# Patient Record
Sex: Female | Born: 1965 | Race: Black or African American | Hispanic: No | Marital: Single | Smoking: Former smoker
Health system: Southern US, Community
[De-identification: ages and names within clinical notes are randomized; demographics above are authoritative.]

## PROBLEM LIST (undated history)

## (undated) DIAGNOSIS — E119 Type 2 diabetes mellitus without complications: Secondary | ICD-10-CM

## (undated) DIAGNOSIS — I639 Cerebral infarction, unspecified: Secondary | ICD-10-CM

## (undated) DIAGNOSIS — I10 Essential (primary) hypertension: Secondary | ICD-10-CM

---

## 2005-10-10 DIAGNOSIS — I10 Essential (primary) hypertension: Secondary | ICD-10-CM | POA: Insufficient documentation

## 2005-10-10 DIAGNOSIS — E785 Hyperlipidemia, unspecified: Secondary | ICD-10-CM | POA: Insufficient documentation

## 2010-04-29 ENCOUNTER — Emergency Department (HOSPITAL_COMMUNITY)
Admission: EM | Admit: 2010-04-29 | Discharge: 2010-04-29 | Payer: Self-pay | Source: Home / Self Care | Admitting: Emergency Medicine

## 2010-06-20 ENCOUNTER — Emergency Department (HOSPITAL_COMMUNITY)
Admission: EM | Admit: 2010-06-20 | Discharge: 2010-06-21 | Disposition: A | Discharge status: Home / Self Care | Payer: PRIVATE HEALTH INSURANCE | Attending: Emergency Medicine | Admitting: Emergency Medicine

## 2010-06-20 DIAGNOSIS — K089 Disorder of teeth and supporting structures, unspecified: Secondary | ICD-10-CM | POA: Insufficient documentation

## 2010-06-20 DIAGNOSIS — K056 Periodontal disease, unspecified: Secondary | ICD-10-CM | POA: Insufficient documentation

## 2010-06-20 DIAGNOSIS — K0510 Chronic gingivitis, plaque induced: Secondary | ICD-10-CM | POA: Insufficient documentation

## 2010-06-20 DIAGNOSIS — K069 Disorder of gingiva and edentulous alveolar ridge, unspecified: Secondary | ICD-10-CM | POA: Insufficient documentation

## 2010-06-20 DIAGNOSIS — I10 Essential (primary) hypertension: Secondary | ICD-10-CM | POA: Insufficient documentation

## 2010-06-20 DIAGNOSIS — I2510 Atherosclerotic heart disease of native coronary artery without angina pectoris: Secondary | ICD-10-CM | POA: Insufficient documentation

## 2010-06-20 DIAGNOSIS — E785 Hyperlipidemia, unspecified: Secondary | ICD-10-CM | POA: Insufficient documentation

## 2010-06-20 DIAGNOSIS — K029 Dental caries, unspecified: Secondary | ICD-10-CM | POA: Insufficient documentation

## 2010-06-20 DIAGNOSIS — J45909 Unspecified asthma, uncomplicated: Secondary | ICD-10-CM | POA: Insufficient documentation

## 2010-06-20 DIAGNOSIS — I252 Old myocardial infarction: Secondary | ICD-10-CM | POA: Insufficient documentation

## 2010-06-20 DIAGNOSIS — Z79899 Other long term (current) drug therapy: Secondary | ICD-10-CM | POA: Insufficient documentation

## 2010-07-15 ENCOUNTER — Emergency Department (HOSPITAL_COMMUNITY)
Admission: EM | Admit: 2010-07-15 | Discharge: 2010-07-15 | Disposition: A | Discharge status: Home / Self Care | Payer: Medicaid Other | Attending: Emergency Medicine | Admitting: Emergency Medicine

## 2010-07-15 DIAGNOSIS — Z7982 Long term (current) use of aspirin: Secondary | ICD-10-CM | POA: Insufficient documentation

## 2010-07-15 DIAGNOSIS — I252 Old myocardial infarction: Secondary | ICD-10-CM | POA: Insufficient documentation

## 2010-07-15 DIAGNOSIS — H5789 Other specified disorders of eye and adnexa: Secondary | ICD-10-CM | POA: Insufficient documentation

## 2010-07-15 DIAGNOSIS — I2510 Atherosclerotic heart disease of native coronary artery without angina pectoris: Secondary | ICD-10-CM | POA: Insufficient documentation

## 2010-07-15 DIAGNOSIS — J45909 Unspecified asthma, uncomplicated: Secondary | ICD-10-CM | POA: Insufficient documentation

## 2010-07-15 DIAGNOSIS — H11419 Vascular abnormalities of conjunctiva, unspecified eye: Secondary | ICD-10-CM | POA: Insufficient documentation

## 2010-07-15 DIAGNOSIS — H1010 Acute atopic conjunctivitis, unspecified eye: Secondary | ICD-10-CM | POA: Insufficient documentation

## 2010-07-15 DIAGNOSIS — Z79899 Other long term (current) drug therapy: Secondary | ICD-10-CM | POA: Insufficient documentation

## 2010-07-15 DIAGNOSIS — E785 Hyperlipidemia, unspecified: Secondary | ICD-10-CM | POA: Insufficient documentation

## 2010-07-15 DIAGNOSIS — I10 Essential (primary) hypertension: Secondary | ICD-10-CM | POA: Insufficient documentation

## 2011-01-13 ENCOUNTER — Emergency Department (HOSPITAL_COMMUNITY)
Admission: EM | Admit: 2011-01-13 | Discharge: 2011-01-13 | Disposition: A | Discharge status: Home / Self Care | Payer: PRIVATE HEALTH INSURANCE | Attending: Emergency Medicine | Admitting: Emergency Medicine

## 2011-01-13 DIAGNOSIS — R079 Chest pain, unspecified: Secondary | ICD-10-CM | POA: Insufficient documentation

## 2011-01-13 DIAGNOSIS — R42 Dizziness and giddiness: Secondary | ICD-10-CM | POA: Insufficient documentation

## 2011-01-13 LAB — DIFFERENTIAL
Basophils Relative: 0 % (ref 0–1)
Lymphs Abs: 4.6 10*3/uL — ABNORMAL HIGH (ref 0.7–4.0)
Monocytes Relative: 7 % (ref 3–12)
Neutro Abs: 5.5 10*3/uL (ref 1.7–7.7)
Neutrophils Relative %: 50 % (ref 43–77)

## 2011-01-13 LAB — COMPREHENSIVE METABOLIC PANEL
ALT: 11 U/L (ref 0–35)
AST: 10 U/L (ref 0–37)
Albumin: 3.7 g/dL (ref 3.5–5.2)
CO2: 29 mEq/L (ref 19–32)
Calcium: 9.3 mg/dL (ref 8.4–10.5)
Creatinine, Ser: 0.66 mg/dL (ref 0.50–1.10)
Sodium: 140 mEq/L (ref 135–145)
Total Protein: 7.3 g/dL (ref 6.0–8.3)

## 2011-01-13 LAB — CK TOTAL AND CKMB (NOT AT ARMC)
CK, MB: 2.7 ng/mL (ref 0.3–4.0)
Relative Index: INVALID (ref 0.0–2.5)
Total CK: 88 U/L (ref 7–177)

## 2011-01-13 LAB — CBC
Hemoglobin: 12.9 g/dL (ref 12.0–15.0)
MCH: 29.9 pg (ref 26.0–34.0)
Platelets: 359 10*3/uL (ref 150–400)
RBC: 4.31 MIL/uL (ref 3.87–5.11)
WBC: 11 10*3/uL — ABNORMAL HIGH (ref 4.0–10.5)

## 2011-01-13 LAB — TROPONIN I: Troponin I: <0.3 ng/mL (ref <0.30)

## 2011-02-19 ENCOUNTER — Emergency Department (HOSPITAL_COMMUNITY)
Admission: EM | Admit: 2011-02-19 | Discharge: 2011-02-19 | Disposition: A | Discharge status: Home / Self Care | Payer: Self-pay | Attending: Emergency Medicine | Admitting: Emergency Medicine

## 2011-02-19 ENCOUNTER — Emergency Department (HOSPITAL_COMMUNITY): Payer: Self-pay

## 2011-02-19 DIAGNOSIS — R51 Headache: Secondary | ICD-10-CM | POA: Insufficient documentation

## 2011-02-19 DIAGNOSIS — R079 Chest pain, unspecified: Secondary | ICD-10-CM | POA: Insufficient documentation

## 2011-02-19 DIAGNOSIS — I10 Essential (primary) hypertension: Secondary | ICD-10-CM | POA: Insufficient documentation

## 2011-02-19 DIAGNOSIS — I2510 Atherosclerotic heart disease of native coronary artery without angina pectoris: Secondary | ICD-10-CM | POA: Insufficient documentation

## 2011-02-19 DIAGNOSIS — E785 Hyperlipidemia, unspecified: Secondary | ICD-10-CM | POA: Insufficient documentation

## 2011-02-19 DIAGNOSIS — I252 Old myocardial infarction: Secondary | ICD-10-CM | POA: Insufficient documentation

## 2011-02-19 DIAGNOSIS — Z79899 Other long term (current) drug therapy: Secondary | ICD-10-CM | POA: Insufficient documentation

## 2011-02-19 DIAGNOSIS — J45909 Unspecified asthma, uncomplicated: Secondary | ICD-10-CM | POA: Insufficient documentation

## 2011-02-19 DIAGNOSIS — M79609 Pain in unspecified limb: Secondary | ICD-10-CM | POA: Insufficient documentation

## 2011-02-19 LAB — CBC
Hemoglobin: 13 g/dL (ref 12.0–15.0)
MCH: 29.6 pg (ref 26.0–34.0)
MCV: 89.3 fL (ref 78.0–100.0)
Platelets: 474 10*3/uL — ABNORMAL HIGH (ref 150–400)
RBC: 4.39 MIL/uL (ref 3.87–5.11)
WBC: 9.5 10*3/uL (ref 4.0–10.5)

## 2011-02-19 LAB — POCT I-STAT TROPONIN I
Troponin i, poc: 0 ng/mL (ref 0.00–0.08)
Troponin i, poc: 0 ng/mL (ref 0.00–0.08)

## 2011-02-19 LAB — DIFFERENTIAL
Lymphocytes Relative: 43 % (ref 12–46)
Lymphs Abs: 4.1 10*3/uL — ABNORMAL HIGH (ref 0.7–4.0)
Monocytes Relative: 8 % (ref 3–12)
Neutro Abs: 4.5 10*3/uL (ref 1.7–7.7)
Neutrophils Relative %: 47 % (ref 43–77)

## 2011-02-19 LAB — BASIC METABOLIC PANEL
CO2: 27 mEq/L (ref 19–32)
Calcium: 9.5 mg/dL (ref 8.4–10.5)
Chloride: 105 mEq/L (ref 96–112)
Creatinine, Ser: 0.61 mg/dL (ref 0.50–1.10)
Glucose, Bld: 106 mg/dL — ABNORMAL HIGH (ref 70–99)

## 2011-02-19 LAB — CK TOTAL AND CKMB (NOT AT ARMC)
CK, MB: 2.6 ng/mL (ref 0.3–4.0)
Total CK: 110 U/L (ref 7–177)

## 2011-06-20 ENCOUNTER — Emergency Department (HOSPITAL_COMMUNITY)
Admission: EM | Admit: 2011-06-20 | Discharge: 2011-06-20 | Disposition: A | Discharge status: Home / Self Care | Payer: No Typology Code available for payment source | Attending: Emergency Medicine | Admitting: Emergency Medicine

## 2011-06-20 ENCOUNTER — Emergency Department (HOSPITAL_COMMUNITY): Payer: No Typology Code available for payment source

## 2011-06-20 ENCOUNTER — Encounter (HOSPITAL_COMMUNITY): Payer: Self-pay | Admitting: *Deleted

## 2011-06-20 DIAGNOSIS — M549 Dorsalgia, unspecified: Secondary | ICD-10-CM

## 2011-06-20 DIAGNOSIS — M25529 Pain in unspecified elbow: Secondary | ICD-10-CM | POA: Insufficient documentation

## 2011-06-20 DIAGNOSIS — M25522 Pain in left elbow: Secondary | ICD-10-CM

## 2011-06-20 DIAGNOSIS — R079 Chest pain, unspecified: Secondary | ICD-10-CM | POA: Insufficient documentation

## 2011-06-20 DIAGNOSIS — Z79899 Other long term (current) drug therapy: Secondary | ICD-10-CM | POA: Insufficient documentation

## 2011-06-20 DIAGNOSIS — I10 Essential (primary) hypertension: Secondary | ICD-10-CM | POA: Insufficient documentation

## 2011-06-20 DIAGNOSIS — J45909 Unspecified asthma, uncomplicated: Secondary | ICD-10-CM | POA: Insufficient documentation

## 2011-06-20 DIAGNOSIS — M546 Pain in thoracic spine: Secondary | ICD-10-CM | POA: Insufficient documentation

## 2011-06-20 DIAGNOSIS — S298XXA Other specified injuries of thorax, initial encounter: Secondary | ICD-10-CM | POA: Insufficient documentation

## 2011-06-20 HISTORY — DX: Essential (primary) hypertension: I10

## 2011-06-20 MED ORDER — IBUPROFEN 800 MG PO TABS
800.0000 mg | ORAL_TABLET | Freq: Once | ORAL | Status: AC
  Administered 2011-06-20: 800 mg via ORAL
  Filled 2011-06-20: qty 1

## 2011-06-20 MED ORDER — NAPROXEN 500 MG PO TABS: 500.0000 mg | ORAL_TABLET | Freq: Two times a day (BID) | ORAL | Status: AC

## 2011-06-20 NOTE — Discharge Instructions (Signed)
Your x-rays have all been normal, please take Naprosyn twice a day and followup with your doctor as needed.  Your back pain should be treated with medicines such as ibuprofen or aleve and this back pain should get better over the next 2 weeks.  However if you develop severe or worsening pain, low back pain with fever, numbness, weakness or inability to walk or urinate, you should return to the ER immediately.  Please follow up with your doctor this week for a recheck if still having symptoms.
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
## 2011-06-20 NOTE — ED Notes (Signed)
Patient back from X-Ray.
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
## 2016-07-02 DIAGNOSIS — I2510 Atherosclerotic heart disease of native coronary artery without angina pectoris: Secondary | ICD-10-CM | POA: Insufficient documentation

## 2020-11-14 DIAGNOSIS — I63512 Cerebral infarction due to unspecified occlusion or stenosis of left middle cerebral artery: Secondary | ICD-10-CM | POA: Insufficient documentation

## 2022-08-12 ENCOUNTER — Emergency Department (HOSPITAL_COMMUNITY): Payer: Medicaid Other

## 2022-08-12 ENCOUNTER — Inpatient Hospital Stay (HOSPITAL_COMMUNITY)
Admission: EM | Admit: 2022-08-12 | Discharge: 2022-08-21 | DRG: 824 | Disposition: A | Discharge status: Home Health | Payer: Medicaid Other | Attending: Internal Medicine | Admitting: Internal Medicine

## 2022-08-12 ENCOUNTER — Encounter (HOSPITAL_COMMUNITY): Payer: Self-pay | Admitting: Emergency Medicine

## 2022-08-12 DIAGNOSIS — Z8249 Family history of ischemic heart disease and other diseases of the circulatory system: Secondary | ICD-10-CM

## 2022-08-12 DIAGNOSIS — I10 Essential (primary) hypertension: Secondary | ICD-10-CM | POA: Diagnosis present

## 2022-08-12 DIAGNOSIS — T461X5A Adverse effect of calcium-channel blockers, initial encounter: Secondary | ICD-10-CM | POA: Diagnosis not present

## 2022-08-12 DIAGNOSIS — I69320 Aphasia following cerebral infarction: Secondary | ICD-10-CM

## 2022-08-12 DIAGNOSIS — J45909 Unspecified asthma, uncomplicated: Secondary | ICD-10-CM | POA: Diagnosis present

## 2022-08-12 DIAGNOSIS — F17210 Nicotine dependence, cigarettes, uncomplicated: Secondary | ICD-10-CM | POA: Diagnosis present

## 2022-08-12 DIAGNOSIS — E538 Deficiency of other specified B group vitamins: Secondary | ICD-10-CM | POA: Insufficient documentation

## 2022-08-12 DIAGNOSIS — K5909 Other constipation: Secondary | ICD-10-CM | POA: Diagnosis present

## 2022-08-12 DIAGNOSIS — T453X5A Adverse effect of enzymes, initial encounter: Secondary | ICD-10-CM | POA: Diagnosis not present

## 2022-08-12 DIAGNOSIS — R109 Unspecified abdominal pain: Secondary | ICD-10-CM

## 2022-08-12 DIAGNOSIS — I2510 Atherosclerotic heart disease of native coronary artery without angina pectoris: Secondary | ICD-10-CM | POA: Diagnosis present

## 2022-08-12 DIAGNOSIS — E790 Hyperuricemia without signs of inflammatory arthritis and tophaceous disease: Secondary | ICD-10-CM

## 2022-08-12 DIAGNOSIS — Z5111 Encounter for antineoplastic chemotherapy: Secondary | ICD-10-CM

## 2022-08-12 DIAGNOSIS — N83201 Unspecified ovarian cyst, right side: Secondary | ICD-10-CM | POA: Diagnosis present

## 2022-08-12 DIAGNOSIS — I440 Atrioventricular block, first degree: Secondary | ICD-10-CM | POA: Diagnosis present

## 2022-08-12 DIAGNOSIS — M24541 Contracture, right hand: Secondary | ICD-10-CM | POA: Diagnosis present

## 2022-08-12 DIAGNOSIS — Z7902 Long term (current) use of antithrombotics/antiplatelets: Secondary | ICD-10-CM

## 2022-08-12 DIAGNOSIS — N179 Acute kidney failure, unspecified: Secondary | ICD-10-CM

## 2022-08-12 DIAGNOSIS — C9000 Multiple myeloma not having achieved remission: Principal | ICD-10-CM | POA: Diagnosis present

## 2022-08-12 DIAGNOSIS — E559 Vitamin D deficiency, unspecified: Secondary | ICD-10-CM

## 2022-08-12 DIAGNOSIS — M8458XA Pathological fracture in neoplastic disease, other specified site, initial encounter for fracture: Secondary | ICD-10-CM | POA: Diagnosis present

## 2022-08-12 DIAGNOSIS — M8440XA Pathological fracture, unspecified site, initial encounter for fracture: Secondary | ICD-10-CM | POA: Insufficient documentation

## 2022-08-12 DIAGNOSIS — I69391 Dysphagia following cerebral infarction: Secondary | ICD-10-CM

## 2022-08-12 DIAGNOSIS — R000 Tachycardia, unspecified: Secondary | ICD-10-CM | POA: Diagnosis present

## 2022-08-12 DIAGNOSIS — D630 Anemia in neoplastic disease: Secondary | ICD-10-CM | POA: Diagnosis present

## 2022-08-12 DIAGNOSIS — N200 Calculus of kidney: Secondary | ICD-10-CM | POA: Diagnosis present

## 2022-08-12 DIAGNOSIS — E1165 Type 2 diabetes mellitus with hyperglycemia: Secondary | ICD-10-CM | POA: Diagnosis present

## 2022-08-12 DIAGNOSIS — I952 Hypotension due to drugs: Secondary | ICD-10-CM | POA: Diagnosis not present

## 2022-08-12 DIAGNOSIS — I69351 Hemiplegia and hemiparesis following cerebral infarction affecting right dominant side: Secondary | ICD-10-CM

## 2022-08-12 DIAGNOSIS — E860 Dehydration: Secondary | ICD-10-CM | POA: Diagnosis present

## 2022-08-12 DIAGNOSIS — Z7982 Long term (current) use of aspirin: Secondary | ICD-10-CM

## 2022-08-12 DIAGNOSIS — E785 Hyperlipidemia, unspecified: Secondary | ICD-10-CM | POA: Diagnosis present

## 2022-08-12 HISTORY — DX: Type 2 diabetes mellitus without complications: E11.9

## 2022-08-12 HISTORY — DX: Cerebral infarction, unspecified: I63.9

## 2022-08-12 LAB — COMPREHENSIVE METABOLIC PANEL
ALT: 15 U/L (ref 0–44)
AST: 15 U/L (ref 15–41)
Albumin: 3.6 g/dL (ref 3.5–5.0)
Alkaline Phosphatase: 62 U/L (ref 38–126)
Anion gap: 13 (ref 5–15)
BUN: 43 mg/dL — ABNORMAL HIGH (ref 6–20)
CO2: 22 mmol/L (ref 22–32)
Calcium: >15 mg/dL (ref 8.9–10.3)
Chloride: 101 mmol/L (ref 98–111)
Creatinine, Ser: 2.48 mg/dL — ABNORMAL HIGH (ref 0.44–1.00)
GFR, Estimated: 22 mL/min — ABNORMAL LOW (ref >60)
Glucose, Bld: 103 mg/dL — ABNORMAL HIGH (ref 70–99)
Potassium: 4 mmol/L (ref 3.5–5.1)
Sodium: 136 mmol/L (ref 135–145)
Total Bilirubin: 0.7 mg/dL (ref 0.3–1.2)
Total Protein: 7.2 g/dL (ref 6.5–8.1)

## 2022-08-12 LAB — CBC WITH DIFFERENTIAL/PLATELET
Abs Immature Granulocytes: 0 10*3/uL (ref 0.00–0.07)
Basophils Absolute: 0 10*3/uL (ref 0.0–0.1)
Basophils Relative: 0 %
Eosinophils Absolute: 0.5 10*3/uL (ref 0.0–0.5)
Eosinophils Relative: 5 %
HCT: 33.7 % — ABNORMAL LOW (ref 36.0–46.0)
Hemoglobin: 10.5 g/dL — ABNORMAL LOW (ref 12.0–15.0)
Lymphocytes Relative: 45 %
Lymphs Abs: 4.9 10*3/uL — ABNORMAL HIGH (ref 0.7–4.0)
MCH: 28.4 pg (ref 26.0–34.0)
MCHC: 31.2 g/dL (ref 30.0–36.0)
MCV: 91.1 fL (ref 80.0–100.0)
Monocytes Absolute: 0.6 10*3/uL (ref 0.1–1.0)
Monocytes Relative: 6 %
Neutro Abs: 4.8 10*3/uL (ref 1.7–7.7)
Neutrophils Relative %: 44 %
Platelets: 347 10*3/uL (ref 150–400)
RBC: 3.7 MIL/uL — ABNORMAL LOW (ref 3.87–5.11)
RDW: 13.9 % (ref 11.5–15.5)
WBC: 10.8 10*3/uL — ABNORMAL HIGH (ref 4.0–10.5)
nRBC: 0 % (ref 0.0–0.2)
nRBC: 0 /100 WBC

## 2022-08-12 LAB — URINALYSIS, ROUTINE W REFLEX MICROSCOPIC
Bilirubin Urine: NEGATIVE
Glucose, UA: NEGATIVE mg/dL
Ketones, ur: NEGATIVE mg/dL
Leukocytes,Ua: NEGATIVE
Nitrite: NEGATIVE
Protein, ur: NEGATIVE mg/dL
Specific Gravity, Urine: 1.012 (ref 1.005–1.030)
pH: 5 (ref 5.0–8.0)

## 2022-08-12 LAB — MAGNESIUM: Magnesium: 1.9 mg/dL (ref 1.7–2.4)

## 2022-08-12 LAB — PHOSPHORUS: Phosphorus: 5.1 mg/dL — ABNORMAL HIGH (ref 2.5–4.6)

## 2022-08-12 LAB — LIPASE, BLOOD: Lipase: 28 U/L (ref 11–51)

## 2022-08-12 MED ORDER — CALCITONIN (SALMON) 200 UNIT/ML IJ SOLN: 4.0000 [IU]/kg | Freq: Two times a day (BID) | INTRAMUSCULAR | Status: DC

## 2022-08-12 MED ORDER — SODIUM CHLORIDE 0.9 % IV BOLUS
1000.0000 mL | Freq: Once | INTRAVENOUS | Status: AC
  Administered 2022-08-12: 1000 mL via INTRAVENOUS

## 2022-08-12 MED ORDER — DEXAMETHASONE SODIUM PHOSPHATE 10 MG/ML IJ SOLN: 10.0000 mg | Freq: Once | INTRAMUSCULAR | Status: DC

## 2022-08-12 MED ORDER — DEXAMETHASONE SODIUM PHOSPHATE 10 MG/ML IJ SOLN
10.0000 mg | Freq: Once | INTRAMUSCULAR | Status: AC
  Administered 2022-08-13: 10 mg via INTRAVENOUS
  Filled 2022-08-12: qty 1

## 2022-08-12 MED ORDER — ZOLEDRONIC ACID 4 MG/100ML IV SOLN: 4.0000 mg | Freq: Once | INTRAVENOUS | Status: DC

## 2022-08-12 MED ORDER — CALCITONIN (SALMON) 200 UNIT/ML IJ SOLN
4.0000 [IU]/kg | Freq: Two times a day (BID) | INTRAMUSCULAR | Status: DC
  Administered 2022-08-13: 326 [IU] via SUBCUTANEOUS
  Filled 2022-08-12: qty 1.63

## 2022-08-12 NOTE — ED Notes (Signed)
Family just arrived and said that the speech is her normal after her strokes prior, and her ambulation is normally better but she does drag the rt leg some.

## 2022-08-12 NOTE — ED Provider Notes (Signed)
[place] EM[facility] DEPARTMENT AT [facility] Provider Note   CSN: [id number] Arrival date & time: 08/12/22  1948     History {Add pertinent medical, surgical, social history, OB history to HPI:1} Chief Complaint  Patient presents with  . Abdominal Pain    Rachael Lee is a 57 y.o. female.   Abdominal Pain   57 year old female presents emergency department with complaints of abdominal pain  Home Medications Prior to Admission medications   Medication Sig Start Date End Date Taking? Authorizing Provider  cholecalciferol (VITAMIN D) 1000 UNITS tablet Take 1,000 Units by mouth daily.    [provider]  clopidogrel (PLAVIX) 75 MG tablet Take 75 mg by mouth daily.    [provider]  tiZANidine (ZANAFLEX) 4 MG tablet Take 4 mg by mouth every 8 (eight) hours as needed. For muscle spasms    [provider]      Allergies    Patient has no known allergies.    Review of Systems   Review of Systems  Gastrointestinal:  Positive for abdominal pain.    Physical Exam Updated Vital Signs BP (!) 144/69 (BP Location: Right Arm)   Pulse 78   Temp 98.4 F (36.9 C) (Oral)   Resp 19   SpO2 98%  Physical Exam  ED Results / Procedures / Treatments   Labs (all labs ordered are listed, but only abnormal results are displayed) Labs Reviewed  CBC WITH DIFFERENTIAL/PLATELET - Abnormal; Notable for the following components:      Result Value   WBC 10.8 (*)    RBC 3.70 (*)    Hemoglobin 10.5 (*)    HCT 33.7 (*)    Lymphs Abs 4.9 (*)    All other components within normal limits  COMPREHENSIVE METABOLIC PANEL - Abnormal; Notable for the following components:   Glucose, Bld 103 (*)    BUN 43 (*)    Creatinine, Ser 2.48 (*)    Calcium >15.0 (*)    GFR, Estimated 22 (*)    All other components within normal limits  URINALYSIS, ROUTINE W REFLEX MICROSCOPIC - Abnormal; Notable for the following components:   APPearance HAZY (*)    Hgb urine  dipstick SMALL (*)    Bacteria, UA RARE (*)    All other components within normal limits  PHOSPHORUS - Abnormal; Notable for the following components:   Phosphorus 5.1 (*)    All other components within normal limits  LIPASE, BLOOD  MAGNESIUM  PARATHYROID HORMONE, INTACT (NO CA)  CALCIUM, IONIZED    EKG None  Radiology No results found.  Procedures Procedures  {Document cardiac monitor, telemetry assessment procedure when appropriate:1}  Medications Ordered in ED Medications  sodium chloride 0.9 % bolus 1,000 mL (1,000 mLs Intravenous New Bag/Given 08/12/22 2221)    ED Course/ Medical Decision Making/ A&P Clinical Course as of 08/12/22 2329  Tue Aug 12, 2022  2221 Spoke with son Rachael Lee who said that patient's prior stroke was in June 2022.  States that since then, has had difficulty with expressing words only able to speak occasional words that are somewhat comprehensible.  States that she also has baseline right upper and right lower extremity weakness of which no acute change upon bringing patient to the emergency department today.  States the patient has history of bilateral ovarian cysts that have been causing her pain for some time which prompted visit today.  Most of care in [place] as she is visiting from out of town.  No known history of cancer, parathyroid or thyroid issues. [CR]  2323 CT ABDOMEN PELVIS WO CONTRAST [CR]    Clinical Course User Index [CR] Peter Garter, PA   {   Click here for ABCD2, HEART and other calculatorsREFRESH Note before signing :1}                          Medical Decision Making Amount and/or Complexity of Data Reviewed Labs: ordered. Radiology: ordered.   ***  {Document critical care time when appropriate:1} {Document review of labs and clinical decision tools ie heart score, Chads2Vasc2 etc:1}  {Document your independent review of radiology images, and any outside records:1} {Document your discussion with family  members, caretakers, and with consultants:1} {Document social determinants of health affecting pt's care:1} {Document your decision making why or why not admission, treatments were needed:1} Final Clinical Impression(s) / ED Diagnoses Final diagnoses:  None    Rx / DC Orders ED Discharge Orders     None

## 2022-08-12 NOTE — ED Triage Notes (Signed)
Pt is having belly pain started last night. In town visiting family. Hx of ovarian cysts. She had stroke with R sided weakness. Normally able to walk on own. Noticed at 7pm not able to walk per baseline. Walked for EMS without pain. Pt has incomprehensible speech. Points to genralized belly when asked where it hurts.

## 2022-08-12 NOTE — ED Provider Triage Note (Signed)
Emergency Medicine Provider Triage Evaluation Note  Rachael Lee , a 57 y.o. female  was evaluated in triage.  Pt complains of abd pain. Level 5 caveat, verbal capacity reduced 2/2 prior cva Pain epigastrium/right upper quad. No urinary complaints or fevers, no melena or brbpr.  Review of Systems  Positive: Abd pain Negative: fever  Physical Exam  There were no vitals taken for this visit. Gen:   Awake, no distress   Resp:  Normal effort  MSK:   Moves extremities without difficulty  Other:  Abd not peritoneal  Medical Decision Making  Medically screening exam initiated at 8:00 PM.  Appropriate orders placed.  Rachael Lee was informed that the remainder of the evaluation will be completed by another provider, this initial triage assessment does not replace that evaluation, and the importance of remaining in the ED until their evaluation is complete.     Rachael Leiter, DO 08/12/22 2001

## 2022-08-13 ENCOUNTER — Encounter (HOSPITAL_COMMUNITY): Payer: Self-pay | Admitting: Critical Care Medicine

## 2022-08-13 DIAGNOSIS — C9000 Multiple myeloma not having achieved remission: Secondary | ICD-10-CM | POA: Diagnosis not present

## 2022-08-13 DIAGNOSIS — K5909 Other constipation: Secondary | ICD-10-CM | POA: Diagnosis present

## 2022-08-13 DIAGNOSIS — I69320 Aphasia following cerebral infarction: Secondary | ICD-10-CM | POA: Diagnosis not present

## 2022-08-13 DIAGNOSIS — D630 Anemia in neoplastic disease: Secondary | ICD-10-CM | POA: Diagnosis present

## 2022-08-13 DIAGNOSIS — N179 Acute kidney failure, unspecified: Secondary | ICD-10-CM | POA: Diagnosis not present

## 2022-08-13 DIAGNOSIS — E785 Hyperlipidemia, unspecified: Secondary | ICD-10-CM | POA: Diagnosis present

## 2022-08-13 DIAGNOSIS — I69351 Hemiplegia and hemiparesis following cerebral infarction affecting right dominant side: Secondary | ICD-10-CM | POA: Diagnosis not present

## 2022-08-13 DIAGNOSIS — I440 Atrioventricular block, first degree: Secondary | ICD-10-CM | POA: Diagnosis present

## 2022-08-13 DIAGNOSIS — M8458XA Pathological fracture in neoplastic disease, other specified site, initial encounter for fracture: Secondary | ICD-10-CM | POA: Diagnosis present

## 2022-08-13 DIAGNOSIS — E559 Vitamin D deficiency, unspecified: Secondary | ICD-10-CM | POA: Diagnosis present

## 2022-08-13 DIAGNOSIS — Z8249 Family history of ischemic heart disease and other diseases of the circulatory system: Secondary | ICD-10-CM | POA: Diagnosis not present

## 2022-08-13 DIAGNOSIS — J45909 Unspecified asthma, uncomplicated: Secondary | ICD-10-CM | POA: Diagnosis present

## 2022-08-13 DIAGNOSIS — E538 Deficiency of other specified B group vitamins: Secondary | ICD-10-CM | POA: Diagnosis not present

## 2022-08-13 DIAGNOSIS — M899 Disorder of bone, unspecified: Secondary | ICD-10-CM

## 2022-08-13 DIAGNOSIS — N83201 Unspecified ovarian cyst, right side: Secondary | ICD-10-CM | POA: Diagnosis present

## 2022-08-13 DIAGNOSIS — M24541 Contracture, right hand: Secondary | ICD-10-CM | POA: Diagnosis present

## 2022-08-13 DIAGNOSIS — E860 Dehydration: Secondary | ICD-10-CM | POA: Diagnosis present

## 2022-08-13 DIAGNOSIS — Z7902 Long term (current) use of antithrombotics/antiplatelets: Secondary | ICD-10-CM | POA: Diagnosis not present

## 2022-08-13 DIAGNOSIS — Z7982 Long term (current) use of aspirin: Secondary | ICD-10-CM | POA: Diagnosis not present

## 2022-08-13 DIAGNOSIS — E1165 Type 2 diabetes mellitus with hyperglycemia: Secondary | ICD-10-CM | POA: Diagnosis present

## 2022-08-13 DIAGNOSIS — N200 Calculus of kidney: Secondary | ICD-10-CM | POA: Diagnosis present

## 2022-08-13 DIAGNOSIS — I10 Essential (primary) hypertension: Secondary | ICD-10-CM | POA: Diagnosis present

## 2022-08-13 DIAGNOSIS — I2510 Atherosclerotic heart disease of native coronary artery without angina pectoris: Secondary | ICD-10-CM | POA: Diagnosis present

## 2022-08-13 DIAGNOSIS — F17210 Nicotine dependence, cigarettes, uncomplicated: Secondary | ICD-10-CM | POA: Diagnosis present

## 2022-08-13 LAB — RENAL FUNCTION PANEL
Albumin: 3.3 g/dL — ABNORMAL LOW (ref 3.5–5.0)
Anion gap: 16 — ABNORMAL HIGH (ref 5–15)
BUN: 39 mg/dL — ABNORMAL HIGH (ref 6–20)
CO2: 19 mmol/L — ABNORMAL LOW (ref 22–32)
Calcium: 12.3 mg/dL — ABNORMAL HIGH (ref 8.9–10.3)
Chloride: 103 mmol/L (ref 98–111)
Creatinine, Ser: 2.27 mg/dL — ABNORMAL HIGH (ref 0.44–1.00)
GFR, Estimated: 24 mL/min — ABNORMAL LOW (ref >60)
Glucose, Bld: 190 mg/dL — ABNORMAL HIGH (ref 70–99)
Phosphorus: 2.5 mg/dL (ref 2.5–4.6)
Potassium: 4.3 mmol/L (ref 3.5–5.1)
Sodium: 138 mmol/L (ref 135–145)

## 2022-08-13 LAB — BASIC METABOLIC PANEL
Anion gap: 9 (ref 5–15)
Anion gap: 12 (ref 5–15)
BUN: 37 mg/dL — ABNORMAL HIGH (ref 6–20)
BUN: 37 mg/dL — ABNORMAL HIGH (ref 6–20)
CO2: 22 mmol/L (ref 22–32)
CO2: 21 mmol/L — ABNORMAL LOW (ref 22–32)
Calcium: 13.1 mg/dL (ref 8.9–10.3)
Calcium: 12 mg/dL — ABNORMAL HIGH (ref 8.9–10.3)
Chloride: 107 mmol/L (ref 98–111)
Chloride: 105 mmol/L (ref 98–111)
Creatinine, Ser: 2.3 mg/dL — ABNORMAL HIGH (ref 0.44–1.00)
Creatinine, Ser: 2.16 mg/dL — ABNORMAL HIGH (ref 0.44–1.00)
GFR, Estimated: 24 mL/min — ABNORMAL LOW (ref >60)
GFR, Estimated: 26 mL/min — ABNORMAL LOW (ref >60)
Glucose, Bld: 110 mg/dL — ABNORMAL HIGH (ref 70–99)
Glucose, Bld: 139 mg/dL — ABNORMAL HIGH (ref 70–99)
Potassium: 4.1 mmol/L (ref 3.5–5.1)
Potassium: 4.5 mmol/L (ref 3.5–5.1)
Sodium: 138 mmol/L (ref 135–145)
Sodium: 138 mmol/L (ref 135–145)

## 2022-08-13 LAB — CBC
HCT: 32.1 % — ABNORMAL LOW (ref 36.0–46.0)
Hemoglobin: 10.3 g/dL — ABNORMAL LOW (ref 12.0–15.0)
MCH: 28.1 pg (ref 26.0–34.0)
MCHC: 32.1 g/dL (ref 30.0–36.0)
MCV: 87.7 fL (ref 80.0–100.0)
Platelets: 361 10*3/uL (ref 150–400)
RBC: 3.66 MIL/uL — ABNORMAL LOW (ref 3.87–5.11)
RDW: 13.7 % (ref 11.5–15.5)
WBC: 10 10*3/uL (ref 4.0–10.5)
nRBC: 0 % (ref 0.0–0.2)

## 2022-08-13 LAB — CREATININE, URINE, RANDOM: Creatinine, Urine: 17 mg/dL

## 2022-08-13 LAB — HIV ANTIBODY (ROUTINE TESTING W REFLEX): HIV Screen 4th Generation wRfx: NONREACTIVE

## 2022-08-13 LAB — GLUCOSE, CAPILLARY
Glucose-Capillary: 95 mg/dL (ref 70–99)
Glucose-Capillary: 202 mg/dL — ABNORMAL HIGH (ref 70–99)
Glucose-Capillary: 173 mg/dL — ABNORMAL HIGH (ref 70–99)
Glucose-Capillary: 147 mg/dL — ABNORMAL HIGH (ref 70–99)
Glucose-Capillary: 135 mg/dL — ABNORMAL HIGH (ref 70–99)

## 2022-08-13 LAB — SODIUM, URINE, RANDOM: Sodium, Ur: 100 mmol/L

## 2022-08-13 LAB — HEMOGLOBIN A1C
Hgb A1c MFr Bld: 7.4 % — ABNORMAL HIGH (ref 4.8–5.6)
Mean Plasma Glucose: 165.68 mg/dL

## 2022-08-13 LAB — MRSA NEXT GEN BY PCR, NASAL: MRSA by PCR Next Gen: NOT DETECTED

## 2022-08-13 LAB — PHOSPHORUS: Phosphorus: 4 mg/dL (ref 2.5–4.6)

## 2022-08-13 LAB — VITAMIN D 25 HYDROXY (VIT D DEFICIENCY, FRACTURES): Vit D, 25-Hydroxy: 12.86 ng/mL — ABNORMAL LOW (ref 30–100)

## 2022-08-13 LAB — MAGNESIUM: Magnesium: 1.8 mg/dL (ref 1.7–2.4)

## 2022-08-13 MED ORDER — ENOXAPARIN SODIUM 30 MG/0.3ML IJ SOSY
30.0000 mg | PREFILLED_SYRINGE | INTRAMUSCULAR | Status: DC
  Administered 2022-08-14 – 2022-08-15 (×2): 30 mg via SUBCUTANEOUS
  Filled 2022-08-13 (×2): qty 0.3

## 2022-08-13 MED ORDER — MELATONIN 3 MG PO TABS
3.0000 mg | ORAL_TABLET | Freq: Every evening | ORAL | Status: DC | PRN
  Administered 2022-08-15 – 2022-08-17 (×3): 3 mg via ORAL
  Filled 2022-08-13 (×4): qty 1

## 2022-08-13 MED ORDER — ONDANSETRON HCL 4 MG/2ML IJ SOLN: 4.0000 mg | Freq: Four times a day (QID) | INTRAMUSCULAR | Status: DC | PRN

## 2022-08-13 MED ORDER — OXYCODONE HCL 5 MG PO TABS
5.0000 mg | ORAL_TABLET | ORAL | Status: DC | PRN
  Administered 2022-08-13 – 2022-08-19 (×7): 5 mg via ORAL
  Filled 2022-08-13 (×7): qty 1

## 2022-08-13 MED ORDER — ACETAMINOPHEN 325 MG PO TABS
650.0000 mg | ORAL_TABLET | Freq: Four times a day (QID) | ORAL | Status: DC | PRN
  Administered 2022-08-15 – 2022-08-19 (×5): 650 mg via ORAL
  Filled 2022-08-13 (×5): qty 2

## 2022-08-13 MED ORDER — SODIUM CHLORIDE 0.9 % IV BOLUS
1000.0000 mL | Freq: Once | INTRAVENOUS | Status: AC
  Administered 2022-08-13: 1000 mL via INTRAVENOUS

## 2022-08-13 MED ORDER — CALCITONIN (SALMON) 200 UNIT/ML IJ SOLN
4.0000 [IU]/kg | Freq: Four times a day (QID) | INTRAMUSCULAR | Status: DC
  Filled 2022-08-13 (×2): qty 1.63

## 2022-08-13 MED ORDER — ORAL CARE MOUTH RINSE: 15.0000 mL | OROMUCOSAL | Status: DC | PRN

## 2022-08-13 MED ORDER — HYDRALAZINE HCL 20 MG/ML IJ SOLN
10.0000 mg | INTRAMUSCULAR | Status: DC | PRN
  Administered 2022-08-13: 10 mg via INTRAVENOUS
  Filled 2022-08-13: qty 1

## 2022-08-13 MED ORDER — AMLODIPINE BESYLATE 5 MG PO TABS
5.0000 mg | ORAL_TABLET | Freq: Every day | ORAL | Status: DC
  Administered 2022-08-13 – 2022-08-14 (×2): 5 mg via ORAL
  Filled 2022-08-13 (×2): qty 1

## 2022-08-13 MED ORDER — INSULIN ASPART 100 UNIT/ML IJ SOLN
0.0000 [IU] | Freq: Three times a day (TID) | INTRAMUSCULAR | Status: DC
  Administered 2022-08-15 – 2022-08-16 (×2): 1 [IU] via SUBCUTANEOUS
  Administered 2022-08-19: 5 [IU] via SUBCUTANEOUS
  Administered 2022-08-20: 1 [IU] via SUBCUTANEOUS
  Administered 2022-08-20: 2 [IU] via SUBCUTANEOUS
  Administered 2022-08-20: 1 [IU] via SUBCUTANEOUS

## 2022-08-13 MED ORDER — CHLORHEXIDINE GLUCONATE CLOTH 2 % EX PADS
6.0000 | MEDICATED_PAD | Freq: Every day | CUTANEOUS | Status: DC
  Administered 2022-08-13 – 2022-08-16 (×5): 6 via TOPICAL

## 2022-08-13 MED ORDER — POLYETHYLENE GLYCOL 3350 17 G PO PACK: 17.0000 g | PACK | Freq: Every day | ORAL | Status: DC | PRN

## 2022-08-13 MED ORDER — CALCITONIN (SALMON) 200 UNIT/ML IJ SOLN
4.0000 [IU]/kg | Freq: Four times a day (QID) | INTRAMUSCULAR | Status: AC
  Administered 2022-08-13: 326 [IU] via SUBCUTANEOUS
  Filled 2022-08-13: qty 1.63

## 2022-08-13 MED ORDER — ENOXAPARIN SODIUM 30 MG/0.3ML IJ SOSY: 30.0000 mg | PREFILLED_SYRINGE | INTRAMUSCULAR | Status: DC

## 2022-08-13 MED ORDER — SODIUM CHLORIDE 0.45 % IV SOLN: INTRAVENOUS | Status: DC

## 2022-08-13 MED ORDER — CALCITONIN (SALMON) 200 UNIT/ML IJ SOLN
4.0000 [IU]/kg | Freq: Once | INTRAMUSCULAR | Status: AC
  Administered 2022-08-13: 326 [IU] via SUBCUTANEOUS
  Filled 2022-08-13: qty 1.63

## 2022-08-13 MED ORDER — DOCUSATE SODIUM 100 MG PO CAPS: 100.0000 mg | ORAL_CAPSULE | Freq: Two times a day (BID) | ORAL | Status: DC | PRN

## 2022-08-13 MED ORDER — SODIUM CHLORIDE 0.9 % IV SOLN: INTRAVENOUS | Status: DC

## 2022-08-13 NOTE — Progress Notes (Signed)
? ?  Inpatient Rehab Admissions Coordinator : ? ?Per therapy recommendations, patient was screened for CIR candidacy by Seri Kimmer RN MSN.  At this time patient appears to be a potential candidate for CIR. I will place a rehab consult per protocol for full assessment. Please call me with any questions. ? ?Yatzil Clippinger RN MSN ?Admissions Coordinator ?[phone] ?  ?

## 2022-08-13 NOTE — Consult Note (Signed)
Chief Complaint: Patient was seen in consultation today for Bone marrow biopsy and sacral lesion biopsy Chief Complaint  Patient presents with   Abdominal Pain   at the request of Dr Adaline Sill  Supervising Physician: Gilmer Mor  Patient Status: [facility] - In-pt  History of Present Illness: Rachael Lee is a 57 y.o. female  Expressive aphasia since CVA 2 yrs ago Presented with abd pain and back pain-- down Bilat legs Worsening since 1- 2 weeks  CT yesterday:   IMPRESSION: 1. No acute intra-abdominal pathology identified. 2. Moderate coronary artery calcification. 3. Moderate asymmetric right renal cortical atrophy. 4. Mild bilateral nonobstructing nephrolithiasis. No urolithiasis. No hydronephrosis. 5. 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 6. Scattered lytic lesions throughout the lumbar spine and pelvis suspicious for osseous metastatic disease or myeloma. Minimally displaced pathologic fracture of the inferior right pubic ramus. 7. Age indeterminate superior endplate fracture of T9 with no significant loss of height   CT Head:  MPRESSION: 1. No acute intracranial abnormality. 2. Large chronic left MCA distribution infarct, with additional small remote right cerebellar infarct. 3. 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  PCCM note: Lytic spine lesions,, compression fracture, back and leg pain. Concern for malignancy.   - needs SPEP and UPEP with associated anemia, renal failure, hypercalcemia -anticipate she will need a biopsy 08/13/2022 Seen and examined. Ca improving. Should be okay for progressive, will have TRH take over 4/25 am. Will also ask nephrology for help working up and treating her hypercalcemia. Bisphosphonate on hold for now. Hold plavix for now. Will ask if IR can biopsy her sacral lesion Myrla Halsted MD PCCM  Request made for Sacral lesion biopsy and Bone marrow biopsy Dr Loreta Ave has reviewed chart  and imaging Approves procedure Scheduled 4/25 in IR  Past Medical History:  Diagnosis Date   Asthma    DM (diabetes mellitus)    Hypertension    Stroke     History reviewed. No pertinent surgical history.  Allergies: Patient has no known allergies.  Medications: Prior to Admission medications   Medication Sig Start Date End Date Taking? Authorizing Provider  cholecalciferol (VITAMIN D) 1000 UNITS tablet Take 1,000 Units by mouth daily.    [provider]  clopidogrel (PLAVIX) 75 MG tablet Take 75 mg by mouth daily.    [provider]  tiZANidine (ZANAFLEX) 4 MG tablet Take 4 mg by mouth every 8 (eight) hours as needed. For muscle spasms    [provider]     Family History  Problem Relation Age of Onset   Hypertension Mother     Social History   Socioeconomic History   Marital status: Single    Spouse name: Not on file   Number of children: Not on file   Years of education: Not on file   Highest education level: Not on file  Occupational History   Not on file  Tobacco Use   Smoking status: Former    Types: Cigarettes    Quit date: 2017    Years since quitting: 7.3   Smokeless tobacco: Not on file  Substance and Sexual Activity   Alcohol use: No   Drug use: Not on file   Sexual activity: Not on file  Other Topics Concern   Not on file  Social History Narrative   Not on file   Social Determinants of Health   Financial Resource Strain: Not on file  Food Insecurity: Not on file  Transportation Needs: Not on file  Physical Activity: Not on file  Stress: Not on file  Social Connections: Not on file    Review of Systems: A 12 point ROS discussed and pertinent positives are indicated in the HPI above.  All other systems are negative.  Vital Signs: BP (!) 151/86 (BP Location: Left Arm)   Pulse 89   Temp 97.8 F (36.6 C) (Oral)   Resp 18   Ht  (1.6 m)   Wt 180 lb 8.9 oz (81.9 kg)   SpO2 98%   BMI 31.98 kg/m      Physical Exam Vitals reviewed.  Cardiovascular:     Rate and Rhythm: Normal rate and regular rhythm.  Pulmonary:     Breath sounds: Normal breath sounds.  Abdominal:     Tenderness: There is abdominal tenderness.  Skin:    General: Skin is warm.  Neurological:     Mental Status: She is alert.     Comments: Expressive aphasia Understands my conversation with her Discussed with Rachael Lee via phone He consents to procedure  Psychiatric:        Behavior: Behavior normal.     Imaging: CT Head Wo Contrast  Result Date: 08/12/2022 CLINICAL DATA:  Initial evaluation for mental status change. EXAM: CT HEAD WITHOUT CONTRAST TECHNIQUE: Contiguous axial images were obtained from the base of the skull through the vertex without intravenous contrast. RADIATION DOSE REDUCTION: This exam was performed according to the departmental dose-optimization program which includes automated exposure control, adjustment of the mA and/or kV according to patient size and/or use of iterative reconstruction technique. COMPARISON:  None Available. FINDINGS: Brain: Large area of encephalomalacia involving the left cerebral hemisphere, consistent with a chronic left MCA distribution infarct. Probable superimposed areas of scattered laminar necrosis within this area. Additional small remote right cerebellar infarct. No acute intracranial hemorrhage. No acute large vessel territory infarct. No mass lesion or midline shift. No hydrocephalus or extra-axial fluid collection. Vascular: No abnormal hyperdense vessel. Scattered calcified atherosclerosis present at the skull base. Skull: Scalp soft tissues and calvarium demonstrate no acute finding. Sinuses/Orbits: Globes and orbital soft tissues within normal limits. 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 Additional chronic mucoperiosteal thickening present about the sphenoethmoidal and maxillary sinuses. No  mastoid effusion. Other: None. IMPRESSION: 1. No acute intracranial abnormality. 2. Large chronic left MCA distribution infarct, with additional small remote right cerebellar infarct. 3. 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 Electronically Signed   By: Rise Mu M.D.   On: 08/12/2022 23:10   CT ABDOMEN PELVIS WO CONTRAST  Result Date: 08/12/2022 CLINICAL DATA:  Abdominal pain, acute, nonlocalized EXAM: CT ABDOMEN AND PELVIS WITHOUT CONTRAST TECHNIQUE: Multidetector CT imaging of the abdomen and pelvis was performed following the standard protocol without IV contrast. RADIATION DOSE REDUCTION: This exam was performed according to the departmental dose-optimization program which includes automated exposure control, adjustment of the mA and/or kV according to patient size and/or use of iterative reconstruction technique. COMPARISON:  None Available. FINDINGS: Lower chest: Moderate coronary artery calcification. Right coronary artery stenting has been performed. Cardiac size within normal limits. No pericardial effusion. Visualized lung bases are clear. Hepatobiliary: No focal liver abnormality is seen. No gallstones, gallbladder wall thickening, or biliary dilatation. Pancreas: Unremarkable Spleen: Unremarkable Adrenals/Urinary Tract: The adrenal glands are unremarkable. The kidneys are normal in position. Moderate asymmetric right renal cortical atrophy. Multiple nonobstructing calculi are seen within  the kidneys bilaterally measuring up to 5 mm within the lower pole the left kidney. No hydronephrosis. No ureteral calculi. Multiple simple cortical cysts are seen within the kidneys bilaterally for which no follow-up imaging is recommended. The bladder is unremarkable. Stomach/Bowel: Stomach is within normal limits. Appendix appears normal. No evidence of bowel wall thickening, distention, or inflammatory changes. Vascular/Lymphatic: Aortic atherosclerosis. No  enlarged abdominal or pelvic lymph nodes. Reproductive: Dermoid cyst within the right ovary identified demonstrating macroscopic fat measuring 7.2 x 9.4 x 7.7 cm. Uterus and left adnexa are unremarkable. Other: No abdominal wall hernia. Musculoskeletal: Degenerative changes are seen within the lumbar spine. There are superimposed scattered lytic lesions noted throughout the lumbar spine and pelvis, the largest of which are is seen within the left iliac spine and axiall image # 56/3 measuring 15 mm, suspicious for osseous metastatic disease or myeloma. Pathologic fracture noted involving the lytic lesion bobbing the inferior right pubic ramus. Age indeterminate superior endplate fracture T9 with no significant loss of height. IMPRESSION: 1. No acute intra-abdominal pathology identified. 2. Moderate coronary artery calcification. 3. Moderate asymmetric right renal cortical atrophy. 4. Mild bilateral nonobstructing nephrolithiasis. No urolithiasis. No hydronephrosis. 5. 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 6. Scattered lytic lesions throughout the lumbar spine and pelvis suspicious for osseous metastatic disease or myeloma. Minimally displaced pathologic fracture of the inferior right pubic ramus. 7. Age indeterminate superior endplate fracture of T9 with no significant loss of height. Aortic Atherosclerosis (ICD10-I70.0). Electronically Signed   By: Helyn Numbers M.D.   On: 08/12/2022 23:06    Labs:  CBC: Recent Labs    08/12/22 2021 08/13/22 0248  WBC 10.8* 10.0  HGB 10.5* 10.3*  HCT 33.7* 32.1*  PLT 347 361    COAGS: No results for input(s): "INR", "APTT" in the last 8760 hours.  BMP: Recent Labs    08/12/22 2021 08/13/22 0248  NA 136 138  K 4.0 4.1  CL 101 107  CO2 22 22  GLUCOSE 103* 110*  BUN 43* 37*  CALCIUM >15.0* 13.1*  CREATININE 2.48* 2.30*  GFRNONAA 22* 24*    LIVER FUNCTION TESTS: Recent Labs    08/12/22 2021  BILITOT 0.7  AST 15  ALT 15  ALKPHOS 62  PROT  7.2  ALBUMIN 3.6    TUMOR MARKERS: No results for input(s): "AFPTM", "CEA", "CA199", "CHROMGRNA" in the last 8760 hours.  Assessment and Plan:  Scheduled for Bone marrow and sacral lesion biopsies in IR 4/15  Risks and benefits of Bone marrow biopsy was discussed with the patient and/or patient's family including, but not limited to bleeding, infection, damage to adjacent structures or low yield requiring additional tests. All of the questions were answered and there is agreement to proceed. Consent signed and in chart.  Risks and benefits of sacral lesion biopsy was discussed with the patient and/or patient's family including, but not limited to bleeding, infection, damage to adjacent structures or low yield requiring additional tests. All of the questions were answered and there is agreement to proceed. Consent signed and in chart.  Thank you for this interesting consult.  I greatly enjoyed meeting Rachael Lee and look forward to participating in their care.  A copy of this report was sent to the requesting provider on this date.  Electronically Signed: Robet Leu, PA-C 08/13/2022, 12:29 PM   I spent a total of 40 Minutes    in face to face in clinical consultation, greater than 50% of which was  counseling/coordinating care for Bone marrow and sacral lesion biopsies

## 2022-08-13 NOTE — Consult Note (Signed)
[facility] Renal Consultation Note  Requesting MD: Levon Hedger, MD  Indication for Consultation:  hypercalcemia   Chief complaint: abdominal pain    HPI:  Rachael Lee is a 57 y.o. female with a history of HTN, DM, prior CVA, and asthma who presented to the hospital with abdominal pain as well as back pain for the past week and a half.  She is here visiting family and lives out of state in [place].  I spoke with her son on the phone in the room via speakerphone.  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  She is still having the abdominal pain.  She denies any NSAID use.  She does take aspirin daily OTC.  She was found to have hypercalcemia as well as acute kidney injury.  Her presenting calcium was greater than 15. high. She has been given calcitonin x 2 doses and has been on normal saline.  Her creatinine is improving with hydration.  Unfortunately, other work-up has revealed scattered lytic lesions throughout the lumbar spine and pelvis suspicious for metastatic disease or myeloma.  Note that she had a stroke 2 years ago and has expressive aphasia secondary to the same as well as right-sided weakness.  Nephrology is consulted for assistance with management of hypercalcemia and AKI.  She had 1.5 liters UOP over 4/23 and has had 820 mL uop thus far today.   CT a/p without hydro; mild bilateral non-obstructing nephrolithiasis.  Nursing states bone marrow biopsy is scheduled for tomorrow.    Creatinine, Ser  Date/Time Value Ref Range Status  08/13/2022 01:08 PM 2.27 (H) 0.44 - 1.00 mg/dL Final  16/01/9603 54:09 AM 2.30 (H) 0.44 - 1.00 mg/dL Final  81/19/1478 29:56 PM 2.48 (H) 0.44 - 1.00 mg/dL Final  21/30/8657 84:69 AM 0.61 0.50 - 1.10 mg/dL Final  62/95/2841 32:44 PM 0.66 0.50 - 1.10 mg/dL Final     PMHx:   Past Medical History:  Diagnosis Date   Asthma    DM (diabetes mellitus)    Hypertension    Stroke     History  reviewed. No pertinent surgical history.  Family Hx:  Family History  Problem Relation Age of Onset   Hypertension Mother   Denies a family history of ESRD or CKD   Social History:  reports that she quit smoking about 7 years ago. Her smoking use included cigarettes. She does not have any smokeless tobacco history on file. She reports that she does not drink alcohol. No history on file for drug use.  Allergies: No Known Allergies  Medications: Prior to Admission medications   Medication Sig Start Date End Date Taking? Authorizing Provider  cholecalciferol (VITAMIN D) 1000 UNITS tablet Take 1,000 Units by mouth daily.    [provider]  clopidogrel (PLAVIX) 75 MG tablet Take 75 mg by mouth daily.    [provider]  tiZANidine (ZANAFLEX) 4 MG tablet Take 4 mg by mouth every 8 (eight) hours as needed. For muscle spasms    [provider]   I have reviewed the patient's current and reported prior to admission medications.  Labs:     Latest Ref Rng & Units 08/13/2022    1:08 PM 08/13/2022    2:48 AM 08/12/2022    8:21 PM  BMP  Glucose 70 - 99 mg/dL 010  272  536   BUN 6 - 20 mg/dL 39  37  43   Creatinine 0.44 - 1.00 mg/dL 6.44  2.30  2.48   Sodium 135 - 145 mmol/L 138  138  136   Potassium 3.5 - 5.1 mmol/L 4.3  4.1  4.0   Chloride 98 - 111 mmol/L 103  107  101   CO2 22 - 32 mmol/L Calcium 8.9 - 10.3 mg/dL 81.1  91.4  >78.2     Urinalysis    Component Value Date/Time   COLORURINE YELLOW 08/12/2022 2000   APPEARANCEUR HAZY (A) 08/12/2022 2000   LABSPEC 1.012 08/12/2022 2000   PHURINE 5.0 08/12/2022 2000   GLUCOSEU NEGATIVE 08/12/2022 2000   HGBUR SMALL (A) 08/12/2022 2000   BILIRUBINUR NEGATIVE 08/12/2022 2000   KETONESUR NEGATIVE 08/12/2022 2000   PROTEINUR NEGATIVE 08/12/2022 2000   NITRITE NEGATIVE 08/12/2022 2000   LEUKOCYTESUR NEGATIVE 08/12/2022 2000     ROS:  Pertinent items noted in HPI and remainder of comprehensive ROS  otherwise negative.  Physical Exam: Vitals:   08/13/22 1200 08/13/22 1537  BP: (!) 151/86   Pulse: 89   Resp: 18   Temp:  98 F (36.7 C)  SpO2: 98%      General:  adult female in bed in NAD  HEENT: NCAT Eyes: EOMI  Neck: sclera anicteric Heart: supple trachea midline  Lungs: clear to auscultation; normal work of breathing on room air  Abdomen: soft/nt/obese habitus/distended  Extremities: trace edema lower extremities  Skin: no rash on extremities exposed  Psych no anxiety or agitation  Neuro: patient is awake and interactive; expressive aphasia but motions with her hands and states some words; on gross exam patient with right sided weakness GU - foley catheter with urine output   Assessment/Plan:  # Hypercalcemia  - Constellation of symptoms worrisome for malignancy  - Continue normal saline  - Redose calcitonin this evening - have ordered for 8 pm  - Hold off on bisphosphonate given renal function  - SPEP is pending. UPEP is ordered - Ordered serum free light chains - If needed for respiratory status can give lasix 20-40 mg IV once.  She is ok on my exam   # AKI  - prerenal and improving with hydration; setting of marked hypercalcemia and concern for plasma cell dyscrasia - note baseline Cr 1.1 per 01/2021 Care Everywhere records  - She will need a biopsy of some type - team has ordered a bone marrow biopsy.  She has been on aspirin and would hold this.  Given the lytic lesions and planned bone marrow biopsy I feel is less likely that she would need a renal biopsy   - Continue hydration as tolerated   # Lytic spine lesions  - Bone marrow biopsy is ordered    # HTN  - Will start amlodipine 5 mg daily for now   # Hx Stroke - residual expressive aphasia and right-sided weakness - she is on aspirin daily at home - would hold if renal biopsy is a consideration and then once determined if not needed can resume.  As above, would proceed with bone marrow biopsy     Continue inpatient monitoring   Estanislado Emms 08/13/2022, 4:36 PM

## 2022-08-13 NOTE — TOC Initial Note (Signed)
Transition of Care [facility]) - Initial/Assessment Note    Patient Details  Name: Rachael Lee MRN: [id number] Date of Birth: 1965/08/21  [facility] CM/SW Contact:    Tom-Johnson, Hershal Coria, RN Phone Number: 08/13/2022, 3:12 PM  Clinical Narrative:                  CM spoke with patient's son, [name] via phone at patient's bedside about needs for post hospital transition. Admitted for Abdominal pains and found to be in AKI. Patient has hx of Ovarian Cyst and son concerned that might be the problem of her abd pains.  Radiology following for Bone marrow and lytic Lesion biopsy, concern for Myeloma/Plasmacytoma. Nephrology following for Hypercalcemia.  Patient is visiting son, [name] from [place] where she lives with two of her sons and their family.  Has hx of Stroke with Rt sided deficit and severe expressive aphasia. [name] states patient has four sons and the two sons she is staying with in [place] are her primary caregivers. Has a rollator.  [name] states patient has [employer] from [place]. CM informed him to call [employer]BS and request if they could switch to [place]. Financial Navigator contacted for [employer] eligibility.  Patient currently has a PCP in [place].   CIR recommended, assessing patient for admit.  CM will continue to follow as patient progresses with care towards discharge.         Expected Discharge Plan: IP Rehab Facility Barriers to Discharge: Continued Medical Work up   Patient Goals and CMS Choice Patient states their goals for this hospitalization and ongoing recovery are:: To go to rehab and return home CMS Medicare.gov Compare Post Acute Care list provided to:: Patient Choice offered to / list presented to : Patient, Adult Children (Son, [place])      Expected Discharge Plan and Services   Discharge Planning Services: CM Consult Post Acute Care Choice: IP Rehab Living arrangements for the past 2 months: Single  Family Home                           HH Arranged: NA HH Agency: NA        Prior Living Arrangements/Services Living arrangements for the past 2 months: Single Family Home Lives with:: Adult Children (Son, [place]) Patient language and need for interpreter reviewed:: Yes Do you feel safe going back to the place where you live?: Yes      Need for Family Participation in Patient Care: Yes (Comment) Care giver support system in place?: Yes (comment) Current home services: DME (Rollator) Criminal Activity/Legal Involvement Pertinent to Current Situation/Hospitalization: No - Comment as needed  Activities of Daily Living      Permission Sought/Granted Permission sought to share information with : Case Manager, Magazine features editor, Family Supports Permission granted to share information with : Yes, Verbal Permission Granted              Emotional Assessment Appearance:: Appears stated age Attitude/Demeanor/Rapport: Gracious Affect (typically observed): Accepting, Appropriate, Hopeful Orientation: : Oriented to Self, Oriented to Place, Oriented to  Time, Oriented to Situation Alcohol / Substance Use: Not Applicable Psych Involvement: No (comment)  Admission diagnosis:  Hypercalcemia [E83.52] AKI (acute kidney injury) [N17.9] Patient Active Problem List   Diagnosis Date Noted   AKI (acute kidney injury) 08/13/2022   Cerebrovascular accident (CVA) due to stenosis of left middle cerebral artery 11/14/2020   CAD (coronary artery disease) 07/02/2016   Hyperlipidemia, unspecified  10/10/2005   Hypertension 10/10/2005   PCP:  [name], MD Pharmacy:   [facility] [address] [place] - [address] [place] [address] Phone: [phone] Fax: [phone]     Social Determinants of Health (SDOH) Social History: SDOH Screenings   Tobacco Use: Medium Risk (08/13/2022)   SDOH Interventions: Transportation  Interventions: Intervention Not Indicated, Inpatient TOC, Patient Resources (Friends/Family)   Readmission Risk Interventions    08/13/2022    2:33 PM  Readmission Risk Prevention Plan  Post Dischage Appt Complete  Medication Screening Complete  Transportation Screening Complete

## 2022-08-13 NOTE — Progress Notes (Addendum)
eLink Physician-Brief Progress Note Patient Name: Rachael Lee DOB: 02/16/1966 MRN: [id number]   Date of Service  08/13/2022  HPI/Events of Note  57 year old female with history of CVA expressive aphasia at baseline, right-sided weakness at baseline who presented with abdominal pain for about 1 day and back pain for 1 week without any associated nausea, vomiting or diarrhea.  She was found to have hypercalcemia and acute kidney injury.  Patient has associated lytic spine lesions and baseline weakness is stable.  Vital signs were fairly unremarkable some tachycardia.  Examination baseline.  Metabolic panel consistent with severe hypercalcemia.  Acute kidney injury with creatinine greater than 2.48.  CBC with mild anemia.  Urinalysis unremarkable.  CT abdomen pelvis with mild bilateral nonobstructing nephrolithiasis, dermoid cyst in the right ovary and scattered lesions throughout the lumbar spine and pelvis.  Unremarkable CT head.  No acute thoracic disease on chest radiography  EKG with intraventricular delay and first-degree AV block.  eICU Interventions  Patient is already receiving aggressive crystalloid resuscitation, calcitonin.  Bisphosphonates held in the setting of renal failure  Urine studies already ordered.  Is not sure whether she takes any muscle relaxers at home-tizanidine on her profile.  We will attempt oxycodone, if this is ineffective, may consider adding this back on.  No immediate intervention is indicated.   0352 - Ca >15 -> 13.1; continue crystalloid infusion, maintain calcitonin, no immediate intervention.  4098 - Add antihypertensives PRN  Intervention Category Evaluation Type: New Patient Evaluation  [name] 08/13/2022, 2:14 AM

## 2022-08-13 NOTE — H&P (Signed)
NAME:  Rachael Lee, MRN:  [id number], DOB:  02/25/1966, LOS: 0 ADMISSION DATE:  08/12/2022, CONSULTATION DATE:  08/13/22 REFERRING MD:  PA Cooper-ED, CHIEF COMPLAINT:  hypercalcemia   History of Present Illness:  [name] is a 57 y/o woman with a history of expressive aphasia since having a stroke 2 years ago who presented with abdominal pain for about 1 day prior to admission.  She has had back pain for about a week and a half since she came to [place] from [place].  She is visiting family.  She has not had nausea, vomiting, diarrhea.  She denies leg numbness.  Over the last day she has been walking less due to pain radiating down her legs.  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  She does not have braces for her contractures.  Her son relates that she has been very thirsty recently and has been drinking a lot of water.  She has not been confused.  In the emergency department her workup was notable for a calcium greater than 15 and acute kidney injury.  PCCM was consulted for evaluation and management.  Pertinent  Medical History  CVA with expressive aphasia, R weakness DM- on metformin PTA HTN Asthma Former tobacco abuse-quit 7 years ago  Significant Hospital Events: Including procedures, antibiotic start and stop dates in addition to other pertinent events   4/24 admission  Interim History / Subjective:    Objective   Blood pressure (!) 144/69, pulse 78, temperature 98.4 F (36.9 C), temperature source Oral, resp. rate 19, height 5\' 3"  (1.6 m), weight 81.6 kg, SpO2 98 %.       No intake or output data in the 24 hours ending 08/13/22 0110 Filed Weights   08/13/22 0001  Weight: 81.6 kg    Examination: General: Chronically ill-appearing woman lying on the stretcher in no acute distress, interactive HENT: [place]/AT, eyes anicteric Lungs: Breathing comfortably on room air,  CTAB. Cardiovascular: S1-S2, regular rate and rhythm Abdomen: Obese, soft, nontender Extremities: No significant edema, no clubbing or cyanosis Neuro: Awake, interactive.  Speech is noncompensable, but she cannot and her son is able to communicate with her effectively.  She is able to move her right arm and leg weakly.  Normal strength on the left.  Sensation appears symmetric in her lower extremities. Derm: Warm, dry, no diffuse rashes  BUN 43 Cr 2.48 Ca+  >15 WBC 10.8 H/H 10.5/33.7  CT personally reviewed-no abnormalities in the lung windows.  Lytic lesions in the spine and pelvis.  Gaseous distention of her intestines.  Right ovarian dermoid cyst.  T9 superior endplate fracture-age-indeterminate, no collapse.  Right renal cortical atrophy, nonobstructive nephrolithiasis.  Resolved Hospital Problem list     Assessment & Plan:   Hypercalcemia causing abdominal pain and renal failure -Additional volume resuscitation- another L NS, followed by 0.45% NS infusion. Monitor for volume overload.  -calcitonin -holding on bisphosphonates due to renal failure currently; may be a candidate for zolendronate if renal failure fails to improve -check PTH, vit D level -check EKG -hold PTA vit D supplements -BMP q6h  AKI; last known Cr was 1.1 in 01/2021 per [facility]where -strict I/O -renally dose meds, avoid nephrotoxic meds -place foley -collect UPEP, urine Na+ & Cr -no obstruction seen on CT; no need for renal US currently  Lytic spine lesions,, compression fracture, back and leg pain. Concern for malignancy.   - needs  SPEP and UPEP with associated anemia, renal failure, hypercalcemia -anticipate she will need a biopsy this admission - Oxycodone as needed for pain  R weakness, contractures -PT, OT, SLP consults  I discussed code status with [name] and her son at bedside.  No previous advanced directives or discussions regarding this. They both want full code.  Family is aware  that her lytic bone lesions may be cancer.   Best Practice (right click and "Reselect all SmartList Selections" daily)   Diet/type: Regular consistency (see orders) DVT prophylaxis: LMWH GI prophylaxis: N/A Lines: N/A Foley:  Yes, and it is still needed Code Status:  full code Last date of multidisciplinary goals of care discussion [4/24]  Labs   CBC: Recent Labs  Lab 08/12/22 2021  WBC 10.8*  NEUTROABS 4.8  HGB 10.5*  HCT 33.7*  MCV 91.1  PLT 347    Basic Metabolic Panel: Recent Labs  Lab 08/12/22 2021  NA 136  K 4.0  CL 101  CO2 22  GLUCOSE 103*  BUN 43*  CREATININE 2.48*  CALCIUM >15.0*  MG 1.9  PHOS 5.1*   GFR: Estimated Creatinine Clearance: 25 mL/min (A) (by C-G formula based on SCr of 2.48 mg/dL (H)). Recent Labs  Lab 08/12/22 2021  WBC 10.8*    Liver Function Tests: Recent Labs  Lab 08/12/22 2021  AST 15  ALT 15  ALKPHOS 62  BILITOT 0.7  PROT 7.2  ALBUMIN 3.6   Recent Labs  Lab 08/12/22 2021  LIPASE 28   No results for input(s): "AMMONIA" in the last 168 hours.  ABG No results found for: "PHART", "PCO2ART", "PO2ART", "HCO3", "TCO2", "ACIDBASEDEF", "O2SAT"   Coagulation Profile: No results for input(s): "INR", "PROTIME" in the last 168 hours.  Cardiac Enzymes: No results for input(s): "CKTOTAL", "CKMB", "CKMBINDEX", "TROPONINI" in the last 168 hours.  HbA1C: No results found for: "HGBA1C"  CBG: No results for input(s): "GLUCAP" in the last 168 hours.  Review of Systems:   Review of Systems  HENT: Negative.    Cardiovascular:  Negative for leg swelling.  Gastrointestinal:  Positive for abdominal pain.  Musculoskeletal:  Positive for back pain.       Leg pain  Neurological:  Negative for weakness.  Endo/Heme/Allergies:        Excessive thirst     Past Medical History:  She,  has a past medical history of Asthma, DM (diabetes mellitus), Hypertension, and Stroke.   Surgical History:  History reviewed. No pertinent  surgical history.   Social History:   reports that she has been smoking. She does not have any smokeless tobacco history on file. She reports that she does not drink alcohol.   Family History:  Her family history includes Hypertension in her mother.   Allergies No Known Allergies   Home Medications  Prior to Admission medications   Medication Sig Start Date End Date Taking? Authorizing Provider  cholecalciferol (VITAMIN D) 1000 UNITS tablet Take 1,000 Units by mouth daily.    [provider]  clopidogrel (PLAVIX) 75 MG tablet Take 75 mg by mouth daily.    [provider]  tiZANidine (ZANAFLEX) 4 MG tablet Take 4 mg by mouth every 8 (eight) hours as needed. For muscle spasms    [provider]     Critical care time:     This patient is critically ill with multiple organ system failure which requires frequent high complexity decision making, assessment, support, evaluation, and titration of therapies. This was completed  through the application of advanced monitoring technologies and extensive interpretation of multiple databases. During this encounter critical care time was devoted to patient care services described in this note for 50 minutes.  Steffanie Dunn, DO 08/13/22 1:10 AM [facility]  For contact information, see Amion. If no response to pager, please call PCCM consult pager. After hours, 7PM- 7AM, please call Elink.

## 2022-08-13 NOTE — Evaluation (Signed)
Speech Language Pathology Evaluation Patient Details Name: Rachael Lee MRN: [id number] DOB: 1966/04/08 Today's Date: 08/13/2022 Time: 1014-1030 SLP Time Calculation (min) (ACUTE ONLY): 16 min  Problem List:  Patient Active Problem List   Diagnosis Date Noted   AKI (acute kidney injury) 08/13/2022   Cerebrovascular accident (CVA) due to stenosis of left middle cerebral artery 11/14/2020   CAD (coronary artery disease) 07/02/2016   Hyperlipidemia, unspecified 10/10/2005   Hypertension 10/10/2005   Past Medical History:  Past Medical History:  Diagnosis Date   Asthma    DM (diabetes mellitus)    Hypertension    Stroke    Past Surgical History: History reviewed. No pertinent surgical history. HPI:  Pt is a 57 y/o female who presented with abdominal pain. SLP consulted per family's request due to pt's baseline aphasia. PMH:  CVA 2 years prior with residual expressive aphasia and right-sided weakness, DM, HTN, asthma.   Assessment / Plan / Recommendation Clinical Impression  Pt was seen for speech-language evaluation. Pt's son was contacted prior to the evaluation to assess pt's baseline. He reported that he moved to [place] to [place] in October. Per the son, pt has had aphasia for 2 years and has been receiving SLP services until she moved here on 08/02/22. He stated that the pt was communicating using words and some phrases, but that he has noted a reduction in verbal output since he left in October and therefore questions the pt's consistency with attending outpatient SLP services. Pt presented with non-fluent aphasia characterized by impairments in receptive and expressive language with more notable difficulty with verbal expression. She followed most 1-step commands and accurately responded to simple yes/no questions, but she demonstrated difficulty with auditory comprehension beyond this level and pt's response to yes/no questions should not be used as a reliable indicator of  comprehension of complex information. Pt's verbal output was mostly restricted to "yes", "no", "alright", and "I don't know" during the evaluation. Motor speech assessment was difficult due to paucity of output, paraphasic errors, and an increased rate of speech, but some articulatory imprecision may have been noted. SLP services are clinically indicated after discharge to continue aphasia treatment and SLP will follow to assist with communication augmentation and family education.    SLP Assessment  SLP Recommendation/Assessment:  (SLP will follow briefly to assist with alternative/augmentative communication, and SLP services are recommended after discharge.) SLP Visit Diagnosis: Aphasia (R47.01)    Recommendations for follow up therapy are one component of a multi-disciplinary discharge planning process, led by the attending physician.  Recommendations may be updated based on patient status, additional functional criteria and insurance authorization.    Follow Up Recommendations   (Continued SLP services at level of care recommended by PT/OT)    Assistance Recommended at Discharge  Frequent or constant Supervision/Assistance  Functional Status Assessment Patient has not had a recent decline in their functional status  Frequency and Duration min 1 x/week  2 weeks      SLP Evaluation Cognition  Overall Cognitive Status: Difficult to assess       Comprehension  Auditory Comprehension Overall Auditory Comprehension: Impaired Yes/No Questions: Impaired Basic Biographical Questions:  (5/5) Complex Questions:  (2/5) Commands: Impaired One Step Basic Commands:  (4/6) Two Step Basic Commands:  (0/3) Conversation: Simple    Expression Expression Primary Mode of Expression: Verbal Verbal Expression Overall Verbal Expression: Impaired Initiation: Impaired Automatic Speech: Counting;Day of week;Month of year (Counting: 3/10; Days: 0/7; Months: 0/12) Level of Generative/Spontaneous  Verbalization: Word  Repetition: Impaired Level of Impairment: Word level;Phrase level (0/5) Naming: Impairment Responsive:  (0/4) Confrontation: Impaired (1/5) Divergent: Not tested Verbal Errors: Phonemic paraphasias;Neologisms Written Expression   Oral / Motor  Motor Speech Overall Motor Speech:  (difficult to assess due to paucity of speech)           Wyeth Hoffer I. Vear Clock, MS, CCC-SLP Acute Rehabilitation Services Office number [phone]  Scheryl Marten 08/13/2022, 12:18 PM

## 2022-08-13 NOTE — Evaluation (Signed)
Occupational Therapy Evaluation Patient Details Name: Rachael Lee MRN: [id number] DOB: May 21, 1965 Today's Date: 08/13/2022   History of Present Illness Rachael Lee is a 57 y/o woman presented with abdominal pain for about 1 day prior to admission. She has had back pain for about a week and a half since she came to [place] from [place]. She is visiting family. Found to have  calcium greater than 15 and acute kidney injury. PHMx:expressive aphasia since having a stroke 2 years ago   Clinical Impression   This 57 yo female admitted with above presents to acute OT with PLOF of needing some A with mobility and ADLs but has the potential to do more for herself and is motivated to do so. She currently is setup/S-Mod A for basic ADLs at a RW level, as well as min guard A-min A for bed mobility and ambulation respectively. She recently moved from [place] to here to live with her son and needs to Mod I during the day from a RW level while her son is at work. She will continue to benefit from acute OT with follow up from intensive inpatient follow up therapy, >3 hours/day.        Recommendations for follow up therapy are one component of a multi-disciplinary discharge planning process, led by the attending physician.  Recommendations may be updated based on patient status, additional functional criteria and insurance authorization.   Assistance Recommended at Discharge Frequent or constant Supervision/Assistance  Patient can return home with the following A little help with walking and/or transfers;A lot of help with bathing/dressing/bathroom;Assistance with cooking/housework;Assist for transportation;Help with stairs or ramp for entrance;Direct supervision/assist for financial management;Direct supervision/assist for medications management    Functional Status Assessment  Patient has had a recent decline in their functional status and demonstrates the ability to make significant improvements  in function in a reasonable and predictable amount of time.  Equipment Recommendations  BSC/3in1 (son to get tub bench)    Recommendations for Other Services Rehab consult     Precautions / Restrictions Precautions Precautions: Fall Restrictions Weight Bearing Restrictions: No      Mobility Bed Mobility Overal bed mobility: Needs Assistance Bed Mobility: Supine to Sit     Supine to sit: Min guard, HOB elevated     General bed mobility comments: increased time and effort    Transfers Overall transfer level: Needs assistance Equipment used: Rolling walker (2 wheels) Transfers: Sit to/from Stand Sit to Stand: Min assist                  Balance Overall balance assessment: Needs assistance Sitting-balance support: No upper extremity supported, Feet unsupported Sitting balance-Leahy Scale: Fair     Standing balance support: Bilateral upper extremity supported, Reliant on assistive device for balance Standing balance-Leahy Scale: Poor Standing balance comment: reliant on RW                           ADL either performed or assessed with clinical judgement   ADL Overall ADL's : Needs assistance/impaired Eating/Feeding: Set up;Sitting   Grooming: Minimal assistance;Sitting   Upper Body Bathing: Minimal assistance;Sitting   Lower Body Bathing: Moderate assistance Lower Body Bathing Details (indicate cue type and reason): min A sit<>stand Upper Body Dressing : Minimal assistance;Sitting   Lower Body Dressing: Moderate assistance Lower Body Dressing Details (indicate cue type and reason): min A sit<>stand Toilet Transfer: Minimal assistance;Ambulation;Rolling walker (2 wheels) Toilet Transfer Details (indicate cue type  and reason): simulated bed>out and walking in hallway of 58m >sit in recliner back in room Toileting- Clothing Manipulation and Hygiene: Moderate assistance Toileting - Clothing Manipulation Details (indicate cue type and reason): min  A sit<>stand             Vision   Additional Comments: issues from prior CVA            Pertinent Vitals/Pain Pain Assessment Pain Assessment: No/denies pain     Hand Dominance Left (since CVA)   Extremity/Trunk Assessment Upper Extremity Assessment Upper Extremity Assessment: RUE deficits/detail RUE Deficits / Details: old CVA with decreased AROM all joints but can open and close hand when she concentrates on it (ie: hold onto walker) RUE Coordination: decreased fine motor;decreased gross motor           Communication Communication Communication: Receptive difficulties;Expressive difficulties   Cognition Arousal/Alertness: Awake/alert Behavior During Therapy: WFL for tasks assessed/performed Overall Cognitive Status: Difficult to assess                                 General Comments: She was not able to tell me yes/no to orientation questions     General Comments  HR ranged from 106-131            Home Living Family/patient expects to be discharged to:: Private residence Living Arrangements: Children Available Help at Discharge:  (Son just started a new job as Personnel officer; grandchild will be home after school so pt will be alone 8-245 pm) Type of Home: House Home Access: Level entry     Home Layout: One level     Bathroom Shower/Tub: Chief Strategy Officer: Standard Bathroom Accessibility: No   Home Equipment: Rollator (4 wheels);Hand held shower head   Additional Comments: Pt is from [place].  Called son Rachael Lee and all info was obtained from him regarding this pt.  Was getting Outpt PT, OT and ST; Has a Hotel manager      Prior Functioning/Environment Prior Level of Function : Needs assist       Physical Assist : ADLs (physical);Mobility (physical) Mobility (physical): Gait;Stairs ADLs (physical): Bathing;Dressing;Grooming;Toileting Mobility Comments: Needs to be Modif Independent ADLs Comments: A with  B/D        OT Problem List: Decreased strength;Decreased range of motion;Impaired balance (sitting and/or standing);Impaired vision/perception;Decreased coordination;Decreased cognition;Impaired UE functional use;Impaired tone;Impaired sensation      OT Treatment/Interventions: Self-care/ADL training;DME and/or AE instruction;Patient/family education;Balance training;Visual/perceptual remediation/compensation;Therapeutic activities    OT Goals(Current goals can be found in the care plan section) Acute Rehab OT Goals Patient Stated Goal: to go to son's house OT Goal Formulation: With patient Time For Goal Achievement: 08/27/22 Potential to Achieve Goals: Good  OT Frequency: Min 2X/week       AM-PAC OT "6 Clicks" Daily Activity     Outcome Measure Help from another person eating meals?: A Little Help from another person taking care of personal grooming?: A Little Help from another person toileting, which includes using toliet, bedpan, or urinal?: A Lot Help from another person bathing (including washing, rinsing, drying)?: A Lot Help from another person to put on and taking off regular upper body clothing?: A Little Help from another person to put on and taking off regular lower body clothing?: A Lot 6 Click Score: 15   End of Session Equipment Utilized During Treatment: Gait belt;Rolling walker (2 wheels) Nurse Communication: Mobility status  Activity  Tolerance: Patient tolerated treatment well Patient left: in chair;with call bell/phone within reach;with chair alarm set  OT Visit Diagnosis: Unsteadiness on feet (R26.81);Other abnormalities of gait and mobility (R26.89);Muscle weakness (generalized) (M62.81);Low vision, both eyes (H54.2);Other symptoms and signs involving cognitive function;Hemiplegia and hemiparesis;Cognitive communication deficit (R41.841) Symptoms and signs involving cognitive functions: Cerebral infarction Hemiplegia - Right/Left: Right Hemiplegia -  dominant/non-dominant: Dominant Hemiplegia - caused by: Cerebral infarction                Time: 1610-9604 OT Time Calculation (min): 30 min Charges:  OT General Charges $OT Visit: 1 Visit OT Evaluation $OT Eval Moderate Complexity: 1 Mod  Cathy L. OT Acute Rehabilitation Services Office [phone]    Evette Georges 08/13/2022, 10:35 AM

## 2022-08-13 NOTE — Progress Notes (Addendum)
08/13/2022  Seen and examined. Ca improving. Should be okay for progressive, will have TRH take over 4/25 am. Will also ask nephrology for help working up and treating her hypercalcemia. Bisphosphonate on hold for now. Hold plavix for now. Will ask if IR can biopsy her sacral lesion  Myrla Halsted MD PCCM

## 2022-08-13 NOTE — Progress Notes (Signed)
Pt belongings:  $40.35, Bra, two bracelets (on person), 4 rings (on person).

## 2022-08-13 NOTE — Evaluation (Signed)
Physical Therapy Evaluation Patient Details Name: Rachael Lee MRN: [id number] DOB: [birth year] Today's Date: 08/13/2022  History of Present Illness  [name] is a 57 y/o woman presented with abdominal pain for about 1 day prior to admission. She has had back pain for about a week and a half since she came to [place] from [place]. She is visiting family. Found to have  calcium greater than 15 and acute kidney injury. PHMx:expressive aphasia since having a stroke 2 years ago  Clinical Impression  This 57 yo female admitted with above presents to acute PT with PLOF of needing some A with mobility and ADLs but has the potential to do more for herself and is motivated to do so. Pt ambulated with RW with min assist to rise and min guard assist to progress ambulation with fatigue needing seated rest break.  Pt recently moved from [place] to here to live with her son and needs to Mod I during the day from a RW level while her son is at work. She will continue to benefit from acute PT with follow up from intensive inpatient follow up therapy, >3 hours/day.        Recommendations for follow up therapy are one component of a multi-disciplinary discharge planning process, led by the attending physician.  Recommendations may be updated based on patient status, additional functional criteria and insurance authorization.  Follow Up Recommendations       Assistance Recommended at Discharge PRN  Patient can return home with the following  A little help with bathing/dressing/bathroom;Assistance with cooking/housework;Assist for transportation;Help with stairs or ramp for entrance    Equipment Recommendations BSC/3in1 (tub bench)  Recommendations for Other Services  Rehab consult    Functional Status Assessment Patient has had a recent decline in their functional status and demonstrates the ability to make significant improvements in function in a reasonable and predictable amount of time.      Precautions / Restrictions Precautions Precautions: Fall Restrictions Weight Bearing Restrictions: No      Mobility  Bed Mobility Overal bed mobility: Needs Assistance Bed Mobility: Supine to Sit     Supine to sit: Min guard, HOB elevated     General bed mobility comments: increased time and effort    Transfers Overall transfer level: Needs assistance Equipment used: Rolling walker (2 wheels) Transfers: Sit to/from Stand Sit to Stand: Min assist           General transfer comment: Cues for hand placement and assist to place right UE on RW.    Ambulation/Gait Ambulation/Gait assistance: Min guard, +2 safety/equipment Gait Distance (Feet): 90 Feet (90 feet x 2) Assistive device: Rolling walker (2 wheels) Gait Pattern/deviations: Step-through pattern, Decreased stride length, Trunk flexed   Gait velocity interpretation: <1.31 ft/sec, indicative of household ambulator   General Gait Details: Pt able to ambulate with RW with cues to stay close to RW occasionally.  Pt safe with RW overall and no LOB.  Followed with chair as pt with very slow gait. Pt took 1 sitting rest break and then walked back to room.  Stairs            Wheelchair Mobility    Modified Rankin (Stroke Patients Only)       Balance Overall balance assessment: Needs assistance Sitting-balance support: No upper extremity supported, Feet unsupported Sitting balance-Leahy Scale: Fair     Standing balance support: Bilateral upper extremity supported, Reliant on assistive device for balance Standing balance-Leahy Scale: Poor Standing balance comment: reliant  on RW                             Pertinent Vitals/Pain Pain Assessment Pain Assessment: No/denies pain    Home Living Family/patient expects to be discharged to:: Private residence Living Arrangements: Children Available Help at Discharge: Available PRN/intermittently (Son just started a new job as Personnel officer;  grandchild will be home after school so pt will be alone 8-245 pm) Type of Home: House Home Access: Level entry       Home Layout: One level Home Equipment: Rollator (4 wheels);Hand held shower head Additional Comments: Pt is from [place].  Called son Rachael Lee and all info was obtained from him regarding this pt.  Was getting Outpt PT, OT and ST; Has a Hotel manager    Prior Function Prior Level of Function : Needs assist       Physical Assist : ADLs (physical);Mobility (physical) Mobility (physical): Gait;Stairs ADLs (physical): Bathing;Dressing;Grooming;Toileting Mobility Comments: Needs to be Modif Independent ADLs Comments: A with B/D     Hand Dominance   Dominant Hand: Left (since CVA)    Extremity/Trunk Assessment   Upper Extremity Assessment Upper Extremity Assessment: Defer to OT evaluation RUE Deficits / Details: old CVA with decreased AROM all joints but can open and close hand when she concentrates on it (ie: hold onto walker) RUE Coordination: decreased fine motor;decreased gross motor    Lower Extremity Assessment Lower Extremity Assessment: Overall WFL for tasks assessed    Cervical / Trunk Assessment Cervical / Trunk Assessment: Kyphotic  Communication   Communication: Receptive difficulties;Expressive difficulties  Cognition Arousal/Alertness: Awake/alert Behavior During Therapy: WFL for tasks assessed/performed Overall Cognitive Status: Difficult to assess                                 General Comments: She was not able to tell me yes/no to orientation questions        General Comments General comments (skin integrity, edema, etc.): HR ranged from 106-131    Exercises     Assessment/Plan    PT Assessment Patient needs continued PT services  PT Problem List Decreased activity tolerance;Decreased balance;Decreased mobility;Decreased knowledge of use of DME;Decreased safety awareness;Cardiopulmonary status limiting  activity       PT Treatment Interventions DME instruction;Gait training;Functional mobility training;Therapeutic activities;Therapeutic exercise;Balance training;Patient/family education    PT Goals (Current goals can be found in the Care Plan section)  Acute Rehab PT Goals Patient Stated Goal: to go home PT Goal Formulation: With patient Time For Goal Achievement: 08/27/22 Potential to Achieve Goals: Good    Frequency Min 3X/week     Co-evaluation               AM-PAC PT "6 Clicks" Mobility  Outcome Measure Help needed turning from your back to your side while in a flat bed without using bedrails?: A Little Help needed moving from lying on your back to sitting on the side of a flat bed without using bedrails?: A Little Help needed moving to and from a bed to a chair (including a wheelchair)?: A Little Help needed standing up from a chair using your arms (e.g., wheelchair or bedside chair)?: A Little Help needed to walk in hospital room?: Total Help needed climbing 3-5 steps with a railing? : Total 6 Click Score: 14    End of Session Equipment Utilized During Treatment: Gait belt  Activity Tolerance: Patient tolerated treatment well Patient left: in chair;with call bell/phone within reach;with chair alarm set Nurse Communication: Mobility status PT Visit Diagnosis: Muscle weakness (generalized) (M62.81)    Time: 7829-5621 PT Time Calculation (min) (ACUTE ONLY): 24 min   Charges:   PT Evaluation $PT Eval Moderate Complexity: 1 Mod          Domnique Vantine M,PT Acute Rehab Services [phone]   Bevelyn Buckles 08/13/2022, 11:58 AM

## 2022-08-14 ENCOUNTER — Inpatient Hospital Stay (HOSPITAL_COMMUNITY): Payer: Medicaid Other

## 2022-08-14 LAB — CBC WITH DIFFERENTIAL/PLATELET
Abs Immature Granulocytes: 0.08 10*3/uL — ABNORMAL HIGH (ref 0.00–0.07)
Basophils Absolute: 0 10*3/uL (ref 0.0–0.1)
Basophils Relative: 0 %
Eosinophils Absolute: 0.1 10*3/uL (ref 0.0–0.5)
Eosinophils Relative: 1 %
HCT: 27.7 % — ABNORMAL LOW (ref 36.0–46.0)
Hemoglobin: 9.4 g/dL — ABNORMAL LOW (ref 12.0–15.0)
Immature Granulocytes: 1 %
Lymphocytes Relative: 14 %
Lymphs Abs: 1.3 10*3/uL (ref 0.7–4.0)
MCH: 28.9 pg (ref 26.0–34.0)
MCHC: 33.9 g/dL (ref 30.0–36.0)
MCV: 85.2 fL (ref 80.0–100.0)
Monocytes Absolute: 0.6 10*3/uL (ref 0.1–1.0)
Monocytes Relative: 6 %
Neutro Abs: 7.2 10*3/uL (ref 1.7–7.7)
Neutrophils Relative %: 78 %
Platelets: 313 10*3/uL (ref 150–400)
RBC: 3.25 MIL/uL — ABNORMAL LOW (ref 3.87–5.11)
RDW: 13.6 % (ref 11.5–15.5)
WBC: 9.2 10*3/uL (ref 4.0–10.5)
nRBC: 0 % (ref 0.0–0.2)

## 2022-08-14 LAB — COMPREHENSIVE METABOLIC PANEL
ALT: 13 U/L (ref 0–44)
AST: 17 U/L (ref 15–41)
Albumin: 3.1 g/dL — ABNORMAL LOW (ref 3.5–5.0)
Alkaline Phosphatase: 49 U/L (ref 38–126)
Anion gap: 9 (ref 5–15)
BUN: 32 mg/dL — ABNORMAL HIGH (ref 6–20)
CO2: 22 mmol/L (ref 22–32)
Calcium: 10.4 mg/dL — ABNORMAL HIGH (ref 8.9–10.3)
Chloride: 108 mmol/L (ref 98–111)
Creatinine, Ser: 1.98 mg/dL — ABNORMAL HIGH (ref 0.44–1.00)
GFR, Estimated: 29 mL/min — ABNORMAL LOW (ref >60)
Glucose, Bld: 112 mg/dL — ABNORMAL HIGH (ref 70–99)
Potassium: 4 mmol/L (ref 3.5–5.1)
Sodium: 139 mmol/L (ref 135–145)
Total Bilirubin: 0.6 mg/dL (ref 0.3–1.2)
Total Protein: 6.2 g/dL — ABNORMAL LOW (ref 6.5–8.1)

## 2022-08-14 LAB — GLUCOSE, CAPILLARY
Glucose-Capillary: 119 mg/dL — ABNORMAL HIGH (ref 70–99)
Glucose-Capillary: 114 mg/dL — ABNORMAL HIGH (ref 70–99)
Glucose-Capillary: 103 mg/dL — ABNORMAL HIGH (ref 70–99)
Glucose-Capillary: 97 mg/dL (ref 70–99)

## 2022-08-14 LAB — PROTIME-INR
INR: 1.2 (ref 0.8–1.2)
Prothrombin Time: 14.9 seconds (ref 11.4–15.2)

## 2022-08-14 LAB — KAPPA/LAMBDA LIGHT CHAINS
Kappa free light chain: 49.2 mg/L — ABNORMAL HIGH (ref 3.3–19.4)
Kappa, lambda light chain ratio: 0.04 — ABNORMAL LOW (ref 0.26–1.65)
Lambda free light chains: 1394.6 mg/L — ABNORMAL HIGH (ref 5.7–26.3)

## 2022-08-14 LAB — TYPE AND SCREEN
ABO/RH(D): B POS
Antibody Screen: NEGATIVE

## 2022-08-14 LAB — ABO/RH: ABO/RH(D): B POS

## 2022-08-14 LAB — PARATHYROID HORMONE, INTACT (NO CA)
PTH: 10 pg/mL — ABNORMAL LOW (ref 15–65)
PTH: 12 pg/mL — ABNORMAL LOW (ref 15–65)

## 2022-08-14 MED ORDER — LIDOCAINE HCL 1 % IJ SOLN: 10.0000 mL | Freq: Once | INTRAMUSCULAR | Status: DC

## 2022-08-14 MED ORDER — FERROUS SULFATE 325 (65 FE) MG PO TABS
325.0000 mg | ORAL_TABLET | Freq: Every day | ORAL | Status: DC
  Administered 2022-08-14 – 2022-08-19 (×6): 325 mg via ORAL
  Filled 2022-08-14 (×6): qty 1

## 2022-08-14 MED ORDER — MIDAZOLAM HCL 2 MG/2ML IJ SOLN
INTRAMUSCULAR | Status: AC
  Filled 2022-08-14: qty 2

## 2022-08-14 MED ORDER — MIDAZOLAM HCL 2 MG/2ML IJ SOLN
INTRAMUSCULAR | Status: AC | PRN
  Administered 2022-08-14: 1 mg via INTRAVENOUS

## 2022-08-14 MED ORDER — HYDRALAZINE HCL 20 MG/ML IJ SOLN: 10.0000 mg | Freq: Four times a day (QID) | INTRAMUSCULAR | Status: DC | PRN

## 2022-08-14 MED ORDER — ATORVASTATIN CALCIUM 40 MG PO TABS
80.0000 mg | ORAL_TABLET | Freq: Every day | ORAL | Status: DC
  Administered 2022-08-14 – 2022-08-20 (×7): 80 mg via ORAL
  Filled 2022-08-14 (×2): qty 2
  Filled 2022-08-14 (×3): qty 1
  Filled 2022-08-14 (×2): qty 2

## 2022-08-14 MED ORDER — POLYETHYLENE GLYCOL 3350 17 G PO PACK
17.0000 g | PACK | Freq: Two times a day (BID) | ORAL | Status: DC
  Administered 2022-08-14 – 2022-08-21 (×7): 17 g via ORAL
  Filled 2022-08-14 (×11): qty 1

## 2022-08-14 MED ORDER — ASPIRIN 81 MG PO TBEC
81.0000 mg | DELAYED_RELEASE_TABLET | Freq: Every day | ORAL | Status: DC
  Administered 2022-08-14 – 2022-08-21 (×8): 81 mg via ORAL
  Filled 2022-08-14 (×8): qty 1

## 2022-08-14 MED ORDER — METOPROLOL TARTRATE 25 MG PO TABS
25.0000 mg | ORAL_TABLET | Freq: Two times a day (BID) | ORAL | Status: DC
  Administered 2022-08-14 – 2022-08-21 (×15): 25 mg via ORAL
  Filled 2022-08-14 (×15): qty 1

## 2022-08-14 MED ORDER — MAGNESIUM HYDROXIDE 400 MG/5ML PO SUSP
30.0000 mL | Freq: Two times a day (BID) | ORAL | Status: AC
  Administered 2022-08-14 (×2): 30 mL via ORAL
  Filled 2022-08-14 (×2): qty 30

## 2022-08-14 MED ORDER — FENTANYL CITRATE (PF) 100 MCG/2ML IJ SOLN
INTRAMUSCULAR | Status: AC
  Filled 2022-08-14: qty 2

## 2022-08-14 MED ORDER — FENTANYL CITRATE (PF) 100 MCG/2ML IJ SOLN
INTRAMUSCULAR | Status: AC | PRN
  Administered 2022-08-14 (×2): 25 ug via INTRAVENOUS

## 2022-08-14 MED ORDER — FLUTICASONE FUROATE-VILANTEROL 100-25 MCG/ACT IN AEPB
1.0000 | INHALATION_SPRAY | Freq: Every day | RESPIRATORY_TRACT | Status: DC
  Administered 2022-08-21: 1 via RESPIRATORY_TRACT
  Filled 2022-08-14 (×2): qty 28

## 2022-08-14 MED ORDER — METOPROLOL TARTRATE 25 MG PO TABS: 25.0000 mg | ORAL_TABLET | Freq: Two times a day (BID) | ORAL | Status: DC

## 2022-08-14 MED ORDER — EZETIMIBE 10 MG PO TABS
10.0000 mg | ORAL_TABLET | Freq: Every day | ORAL | Status: DC
  Administered 2022-08-14 – 2022-08-21 (×8): 10 mg via ORAL
  Filled 2022-08-14 (×8): qty 1

## 2022-08-14 MED ORDER — TICAGRELOR 90 MG PO TABS
90.0000 mg | ORAL_TABLET | Freq: Two times a day (BID) | ORAL | Status: DC
  Administered 2022-08-15 – 2022-08-21 (×13): 90 mg via ORAL
  Filled 2022-08-14 (×14): qty 1

## 2022-08-14 MED ORDER — DOCUSATE SODIUM 100 MG PO CAPS
200.0000 mg | ORAL_CAPSULE | Freq: Two times a day (BID) | ORAL | Status: DC
  Administered 2022-08-14 – 2022-08-21 (×8): 200 mg via ORAL
  Filled 2022-08-14 (×12): qty 2

## 2022-08-14 MED ORDER — AMLODIPINE BESYLATE 5 MG PO TABS
5.0000 mg | ORAL_TABLET | Freq: Every day | ORAL | Status: DC
  Administered 2022-08-15 – 2022-08-16 (×2): 5 mg via ORAL
  Filled 2022-08-14 (×2): qty 1

## 2022-08-14 NOTE — Progress Notes (Signed)
24 hour urine collection complete and hand delivered to the lab.

## 2022-08-14 NOTE — Progress Notes (Addendum)
Inpatient Rehab Admissions Coordinator:   Spoke to pt's son on the phone.  He's planning to come to the hospital this afternoon and we will meet at bedside.    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  They should not have to switch this policy.  I asked him to bring a copy of the card and I will confirm policy is active and have account updated if needed.    Addendum: I met with patient and her son, [name], at bedside to discuss CIR recommendations and goals/expectations of CIR stay.  We reviewed 3 hrs/day of therapy, physician follow up, and average length of stay 2 weeks (dependent upon progress) with goals tbd.  We discussed that acute workup still ongoing and will need to be complete prior to any transition to CIR.  We discussed insurance authorization.  [name] provided me with a card and we are working to verify eligibility.  Will continue to follow.     Estill Dooms, PT, DPT Admissions Coordinator [phone] 08/14/22  12:37 PM

## 2022-08-14 NOTE — Progress Notes (Signed)
Neck right-sided hemiparesis right arm weaker than the right leg, some expressive aphasia which is chronic and at baseline,                                                                   PROGRESS NOTE                                                                                                                                                                                                             Patient Demographics:    Rachael Lee, is a 57 y.o. female, DOB - [birth year], ZOX:[id number]  Outpatient Primary MD for the patient is [name], [place]    LOS - 1  Admit date - 08/12/2022    Chief Complaint  Patient presents with   Abdominal Pain       Brief Narrative (HPI from H&P)    o who presented with abdominal pain for about 1 day prior to admission. She has had back pain for about a week and a half since she came to [place] from [place]. She is visiting family. She has not had nausea, vomiting, diarrhea. She denies leg numbness. Over the last day she has been walking less due to pain radiating down her legs. 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 She does not have braces for her contractures. Her son relates that she has been very thirsty recently and has been drinking a lot of water. She has not been confused. In the emergency department her workup was notable for a calcium greater than 15 and acute kidney injury. PCCM was consulted for evaluation and management.   Was admitted by PCCM seen by nephrology, she was transferred to [facility] under my service on 08/14/2022.   Subjective:    Rachael Lee today has, No headache, No chest pain, No abdominal pain - No Nausea, No new weakness tingling or numbness, no SOB, +ve back pain   Assessment  & Plan :     Hypercalcemia causing abdominal pain, dehydration and renal failure with lytic spine lesions and back pain -   She has been seen by PCCM and  Renal, managing hypercalcemia being hydrated, received salmon calcitonin, monitor CMP closely, suspicious for multiple myeloma,  bone biopsy due by IR on 08/14/2022 follow results.  Also check KUB to rule out constipation.  For now she is improving.  Lytic spine lesions,, compression fracture, back and leg pain. Concern for malignancy.   Bone biopsy and pain control,  SPEP and UPEP per nephrology.  Pain controlled.   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  PT OT and speech eval.  On DAPT,  Will commence her aspirin, Zetia and statin now, Plavix/Brilinta from 08/15/2022 after her bone biopsy today.  Dyslipidemia.  Continue statin and Zetia combination.  Hypertension.  Home dose Norvasc and Lopressor.      Condition - Extremely Guarded  Family Communication  :  son Rachael Lee [phone]  on 08/14/22  Code Status :  Full  Consults  :  PCCM, Renal, IR  PUD Prophylaxis :    Procedures  :     CT Head - 1. No acute intracranial abnormality. 2. Large chronic left MCA distribution infarct, with additional small remote right cerebellar infarct. 3. 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  CT - 1. No acute intra-abdominal pathology identified. 2. Moderate coronary artery calcification. 3. Moderate asymmetric right renal cortical atrophy. 4. Mild bilateral nonobstructing nephrolithiasis. No urolithiasis. No hydronephrosis. 5. 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 6. Scattered lytic lesions throughout the lumbar spine and pelvis suspicious for osseous metastatic disease or myeloma. Minimally displaced pathologic fracture of the inferior right pubic ramus. 7. Age indeterminate superior endplate fracture of T9 with no significant loss of height. Aortic Atherosclerosis       Disposition Plan  :    Status is: Inpatient   DVT Prophylaxis  :    enoxaparin  (LOVENOX) injection 30 mg Start: 08/14/22 1400 SCDs Start: 08/13/22 0057    Lab Results  Component Value Date   PLT 313 08/14/2022    Diet :  Diet Order             Diet NPO time specified Except for: Sips with Meds  Diet effective midnight                    Inpatient Medications  Scheduled Meds:  amLODipine  5 mg Oral Daily   Chlorhexidine Gluconate Cloth  6 each Topical Daily   enoxaparin (LOVENOX) injection  30 mg Subcutaneous Q24H   insulin aspart  0-6 Units Subcutaneous TID WC   lidocaine  10 mL Intradermal Once   lidocaine  10 mL Intradermal Once   Continuous Infusions:  sodium chloride 100 mL/hr at 08/13/22 1825   PRN Meds:.acetaminophen, docusate sodium, hydrALAZINE, melatonin, ondansetron (ZOFRAN) IV, mouth rinse, oxyCODONE, polyethylene glycol  Antibiotics  :    Anti-infectives (From admission, onward)    None         Objective:   Vitals:   08/14/22 1000 08/14/22 1005 08/14/22 1010 08/14/22 1015  BP: (!) 150/68 (!) 140/70 137/79 (!) 152/69  Pulse: 88 85 83 84  Resp: Temp:      TempSrc:      SpO2: 97% 98% 100% 98%  Weight:      Height:        Wt Readings from Last 3 Encounters:  08/14/22 86 kg     Intake/Output Summary (Last 24 hours) at 08/14/2022 1051 Last data filed at 08/14/2022 0332 Gross per 24 hour  Intake 413.64 ml  Output  1560 ml  Net -1146.36 ml     Physical Exam  Awake Alert, Chr R arm >> R leg weakness, expressive aphasia [place].AT,PERRAL Supple Neck, No JVD,   Symmetrical Chest wall movement, Good air movement bilaterally, CTAB RRR,No Gallops,Rubs or new Murmurs,  +ve B.Sounds, Abd Soft, No tenderness,   No Cyanosis, Clubbing or edema        Data Review:    Recent Labs  Lab 08/12/22 2021 08/13/22 0248 08/14/22 0630  WBC 10.8* 10.0 9.2  HGB 10.5* 10.3* 9.4*  HCT 33.7* 32.1* 27.7*  PLT 347 361 313  MCV 91.1 87.7 85.2  MCH 28.4 28.1 28.9  MCHC 31.2 32.1 33.9  RDW 13.9 13.7 13.6  LYMPHSABS  4.9*  --  1.3  MONOABS 0.6  --  0.6  EOSABS 0.5  --  0.1  BASOSABS 0.0  --  0.0    Recent Labs  Lab 08/12/22 2021 08/13/22 0248 08/13/22 1308 08/13/22 1817 08/14/22 0629  NA 136 138 138 138  --   K 4.0 4.1 4.3 4.5  --   CL 101 107 103 105  --   CO2 22 22 19* 21*  --   ANIONGAP 13 9 16* 12  --   GLUCOSE 103* 110* 190* 139*  --   BUN 43* 37* 39* 37*  --   CREATININE 2.48* 2.30* 2.27* 2.16*  --   AST 15  --   --   --   --   ALT 15  --   --   --   --   ALKPHOS 62  --   --   --   --   BILITOT 0.7  --   --   --   --   ALBUMIN 3.6  --  3.3*  --   --   INR  --   --   --   --  1.2  HGBA1C  --   --  7.4*  --   --   MG 1.9 1.8  --   --   --   CALCIUM >15.0* 13.1* 12.3* 12.0*  --       Recent Labs  Lab 08/12/22 2021 08/13/22 0248 08/13/22 1308 08/13/22 1817 08/14/22 0629  INR  --   --   --   --  1.2  HGBA1C  --   --  7.4*  --   --   MG 1.9 1.8  --   --   --   CALCIUM >15.0* 13.1* 12.3* 12.0*  --      Lab Results  Component Value Date   HGBA1C 7.4 (H) 08/13/2022      Radiology Reports CT Head Wo Contrast  Result Date: 08/12/2022 CLINICAL DATA:  Initial evaluation for mental status change. EXAM: CT HEAD WITHOUT CONTRAST TECHNIQUE: Contiguous axial images were obtained from the base of the skull through the vertex without intravenous contrast. RADIATION DOSE REDUCTION: This exam was performed according to the departmental dose-optimization program which includes automated exposure control, adjustment of the mA and/or kV according to patient size and/or use of iterative reconstruction technique. COMPARISON:  None Available. FINDINGS: Brain: Large area of encephalomalacia involving the left cerebral hemisphere, consistent with a chronic left MCA distribution infarct. Probable superimposed areas of scattered laminar necrosis within this area. Additional small remote right cerebellar infarct. No acute intracranial hemorrhage. No acute large vessel territory infarct. No mass lesion  or midline shift. No hydrocephalus or extra-axial fluid collection. Vascular: No abnormal hyperdense vessel. Scattered  calcified atherosclerosis present at the skull base. Skull: Scalp soft tissues and calvarium demonstrate no acute finding. Sinuses/Orbits: Globes and orbital soft tissues within normal limits. 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 Additional chronic mucoperiosteal thickening present about the sphenoethmoidal and maxillary sinuses. No mastoid effusion. Other: None. IMPRESSION: 1. No acute intracranial abnormality. 2. Large chronic left MCA distribution infarct, with additional small remote right cerebellar infarct. 3. 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 Electronically Signed   By: Rise Mu M.D.   On: 08/12/2022 23:10   CT ABDOMEN PELVIS WO CONTRAST  Result Date: 08/12/2022 CLINICAL DATA:  Abdominal pain, acute, nonlocalized EXAM: CT ABDOMEN AND PELVIS WITHOUT CONTRAST TECHNIQUE: Multidetector CT imaging of the abdomen and pelvis was performed following the standard protocol without IV contrast. RADIATION DOSE REDUCTION: This exam was performed according to the departmental dose-optimization program which includes automated exposure control, adjustment of the mA and/or kV according to patient size and/or use of iterative reconstruction technique. COMPARISON:  None Available. FINDINGS: Lower chest: Moderate coronary artery calcification. Right coronary artery stenting has been performed. Cardiac size within normal limits. No pericardial effusion. Visualized lung bases are clear. Hepatobiliary: No focal liver abnormality is seen. No gallstones, gallbladder wall thickening, or biliary dilatation. Pancreas: Unremarkable Spleen: Unremarkable Adrenals/Urinary Tract: The adrenal glands are unremarkable. The kidneys are normal in position. Moderate asymmetric right renal cortical atrophy.  Multiple nonobstructing calculi are seen within the kidneys bilaterally measuring up to 5 mm within the lower pole the left kidney. No hydronephrosis. No ureteral calculi. Multiple simple cortical cysts are seen within the kidneys bilaterally for which no follow-up imaging is recommended. The bladder is unremarkable. Stomach/Bowel: Stomach is within normal limits. Appendix appears normal. No evidence of bowel wall thickening, distention, or inflammatory changes. Vascular/Lymphatic: Aortic atherosclerosis. No enlarged abdominal or pelvic lymph nodes. Reproductive: Dermoid cyst within the right ovary identified demonstrating macroscopic fat measuring 7.2 x 9.4 x 7.7 cm. Uterus and left adnexa are unremarkable. Other: No abdominal wall hernia. Musculoskeletal: Degenerative changes are seen within the lumbar spine. There are superimposed scattered lytic lesions noted throughout the lumbar spine and pelvis, the largest of which are is seen within the left iliac spine and axiall image # 56/3 measuring 15 mm, suspicious for osseous metastatic disease or myeloma. Pathologic fracture noted involving the lytic lesion bobbing the inferior right pubic ramus. Age indeterminate superior endplate fracture T9 with no significant loss of height. IMPRESSION: 1. No acute intra-abdominal pathology identified. 2. Moderate coronary artery calcification. 3. Moderate asymmetric right renal cortical atrophy. 4. Mild bilateral nonobstructing nephrolithiasis. No urolithiasis. No hydronephrosis. 5. 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 6. Scattered lytic lesions throughout the lumbar spine and pelvis suspicious for osseous metastatic disease or myeloma. Minimally displaced pathologic fracture of the inferior right pubic ramus. 7. Age indeterminate superior endplate fracture of T9 with no significant loss of height. Aortic Atherosclerosis (ICD10-I70.0). Electronically Signed   By: Helyn Numbers M.D.   On: 08/12/2022 23:06       Signature  -   Susa Raring M.D on 08/14/2022 at 10:51 AM   -  To page go to [email or web address]

## 2022-08-14 NOTE — Procedures (Signed)
Interventional Radiology Procedure Note  Date of Procedure: 08/14/2022  Procedure: CT bone lesion biopsy, BMBx   Findings:  1. CT bone lesion biopsy left posterior ilium 11ga x1 core  2. CT BMBx right posterior ilium    Complications: No immediate complications noted.   Estimated Blood Loss: minimal  Follow-up and Recommendations: 1. Bedrest 1 hour    Olive Bass, MD  Vascular & Interventional Radiology  08/14/2022 10:19 AM

## 2022-08-14 NOTE — Progress Notes (Signed)
[facility] Progress Note  Name: Rachael Lee MRN: [id number] DOB: 09/02/1965  Chief Complaint:  Abdominal pain and back pain   Subjective:  She had 1.9 liters UOP over 4/24.  She has been on normal saline at 100 ml/hr.   She had a bone lesion biopsy as well as a bone marrow biopsy earlier today.  Her son is at bedside and helps to interpret    Review of systems:  She denies shortness of breath or chest pain  Denies n/v.  She reports abdominal discomfort is about the same as yesterday and she relates it to a cyst on her ovary per her son (she motions and speaks intermittently and he interpreted that)  ------------- Background on referral:  Rachael Lee is a 57 y.o. female with a history of HTN, DM, prior CVA, and asthma who presented to the hospital with abdominal pain as well as back pain for the past week and a half.  She is here visiting family and lives out of state in [place].  I spoke with her son on the phone in the room via speakerphone.  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  She is still having the abdominal pain.  She denies any NSAID use.  She does take aspirin daily OTC.  She was found to have hypercalcemia as well as acute kidney injury.  Her presenting calcium was greater than 15. high. She has been given calcitonin x 2 doses and has been on normal saline.  Her creatinine is improving with hydration.  Unfortunately, other work-up has revealed scattered lytic lesions throughout the lumbar spine and pelvis suspicious for metastatic disease or myeloma.  Note that she had a stroke 2 years ago and has expressive aphasia secondary to the same as well as right-sided weakness.  Nephrology is consulted for assistance with management of hypercalcemia and AKI.  She had 1.5 liters UOP over 4/23 and has had 820 mL uop thus far today.   CT a/p without hydro; mild bilateral non-obstructing nephrolithiasis.  Nursing states bone marrow  biopsy is scheduled for tomorrow.      Intake/Output Summary (Last 24 hours) at 08/14/2022 1441 Last data filed at 08/14/2022 1100 Gross per 24 hour  Intake 690.02 ml  Output 950 ml  Net -259.98 ml    Vitals:  Vitals:   08/14/22 1005 08/14/22 1010 08/14/22 1015 08/14/22 1200  BP: (!) 140/70 137/79 (!) 152/69 130/77  Pulse: 85 83 84   Resp: Temp:      TempSrc:      SpO2: 98% 100% 98%   Weight:      Height:         Physical Exam:  General:  adult female in bed in NAD  HEENT: NCAT Eyes: EOMI  Neck: sclera anicteric Heart: supple trachea midline  Lungs: clear to auscultation; normal work of breathing on room air  Abdomen: soft/nt/obese habitus/distended  Extremities: trace edema lower extremities  Skin: no rash on extremities exposed  Psych no anxiety or agitation  Neuro: patient is awake and interactive; expressive aphasia but motions with her hands and states some words; on gross exam patient with right sided weakness  Medications reviewed   Labs:     Latest Ref Rng & Units 08/14/2022   11:16 AM 08/13/2022    6:17 PM 08/13/2022    1:08 PM  BMP  Glucose 70 - 99 mg/dL 409  811  914  BUN 6 - 20 mg/dL 32  37  39   Creatinine 0.44 - 1.00 mg/dL 5.78  4.69  6.29   Sodium 135 - 145 mmol/L 139  138  138   Potassium 3.5 - 5.1 mmol/L 4.0  4.5  4.3   Chloride 98 - 111 mmol/L 108  105  103   CO2 22 - 32 mmol/L Calcium 8.9 - 10.3 mg/dL 52.8  41.3  24.4      Assessment/Plan:   # Hypercalcemia  - Constellation of symptoms worrisome for malignancy. PTH appropriately low - Continue normal saline  - Defer bisphosphonate given renal function  - SPEP, UPEP, and serum free light chains pending - Would consult hem/onc    # AKI  - prerenal and improving with hydration; setting of marked hypercalcemia and concern for plasma cell dyscrasia - note baseline Cr 1.1 per 01/2021 Care Everywhere records  - Resolving with hydration    # Lytic spine lesions   - Bone marrow biopsy is pending      # HTN  - amlodipine 5 mg daily for now    # Hx Stroke - residual expressive aphasia and right-sided weakness - Can resume home aspirin from my standpoint per primary team - procedures earlier today   # Normocytic Anemia  - in setting of malignancy   Disposition - would continue inpatient monitoring   Estanislado Emms, MD 08/14/2022 3:02 PM

## 2022-08-15 ENCOUNTER — Inpatient Hospital Stay (HOSPITAL_COMMUNITY): Payer: Medicaid Other

## 2022-08-15 DIAGNOSIS — K5909 Other constipation: Secondary | ICD-10-CM | POA: Insufficient documentation

## 2022-08-15 DIAGNOSIS — E790 Hyperuricemia without signs of inflammatory arthritis and tophaceous disease: Secondary | ICD-10-CM | POA: Insufficient documentation

## 2022-08-15 LAB — PROTEIN ELECTROPHORESIS, SERUM
A/G Ratio: 1.2 (ref 0.7–1.7)
Albumin ELP: 3.5 g/dL (ref 2.9–4.4)
Alpha-1-Globulin: 0.3 g/dL (ref 0.0–0.4)
Alpha-2-Globulin: 0.8 g/dL (ref 0.4–1.0)
Beta Globulin: 0.9 g/dL (ref 0.7–1.3)
Gamma Globulin: 0.9 g/dL (ref 0.4–1.8)
Globulin, Total: 2.9 g/dL (ref 2.2–3.9)
Total Protein ELP: 6.4 g/dL (ref 6.0–8.5)

## 2022-08-15 LAB — CBC WITH DIFFERENTIAL/PLATELET
Abs Immature Granulocytes: 0.02 10*3/uL (ref 0.00–0.07)
Basophils Absolute: 0 10*3/uL (ref 0.0–0.1)
Basophils Relative: 0 %
Eosinophils Absolute: 0 10*3/uL (ref 0.0–0.5)
Eosinophils Relative: 1 %
HCT: 27.3 % — ABNORMAL LOW (ref 36.0–46.0)
Hemoglobin: 9 g/dL — ABNORMAL LOW (ref 12.0–15.0)
Immature Granulocytes: 0 %
Lymphocytes Relative: 35 %
Lymphs Abs: 2.7 10*3/uL (ref 0.7–4.0)
MCH: 28.3 pg (ref 26.0–34.0)
MCHC: 33 g/dL (ref 30.0–36.0)
MCV: 85.8 fL (ref 80.0–100.0)
Monocytes Absolute: 0.6 10*3/uL (ref 0.1–1.0)
Monocytes Relative: 7 %
Neutro Abs: 4.4 10*3/uL (ref 1.7–7.7)
Neutrophils Relative %: 57 %
Platelets: 279 10*3/uL (ref 150–400)
RBC: 3.18 MIL/uL — ABNORMAL LOW (ref 3.87–5.11)
RDW: 13.7 % (ref 11.5–15.5)
WBC: 7.7 10*3/uL (ref 4.0–10.5)
nRBC: 0 % (ref 0.0–0.2)

## 2022-08-15 LAB — COMPREHENSIVE METABOLIC PANEL
ALT: 12 U/L (ref 0–44)
AST: 14 U/L — ABNORMAL LOW (ref 15–41)
Albumin: 2.8 g/dL — ABNORMAL LOW (ref 3.5–5.0)
Alkaline Phosphatase: 46 U/L (ref 38–126)
Anion gap: 4 — ABNORMAL LOW (ref 5–15)
BUN: 27 mg/dL — ABNORMAL HIGH (ref 6–20)
CO2: 23 mmol/L (ref 22–32)
Calcium: 9.7 mg/dL (ref 8.9–10.3)
Chloride: 110 mmol/L (ref 98–111)
Creatinine, Ser: 1.73 mg/dL — ABNORMAL HIGH (ref 0.44–1.00)
GFR, Estimated: 34 mL/min — ABNORMAL LOW (ref >60)
Glucose, Bld: 88 mg/dL (ref 70–99)
Potassium: 3.7 mmol/L (ref 3.5–5.1)
Sodium: 137 mmol/L (ref 135–145)
Total Bilirubin: 0.6 mg/dL (ref 0.3–1.2)
Total Protein: 5.4 g/dL — ABNORMAL LOW (ref 6.5–8.1)

## 2022-08-15 LAB — UPEP/UIFE/LIGHT CHAINS/TP, 24-HR UR
% BETA, Urine: 4.9 %
ALPHA 1 URINE: 0.3 %
Albumin, U: 9.6 %
Alpha 2, Urine: 2.1 %
Free Kappa Lt Chains,Ur: 183.85 mg/L — ABNORMAL HIGH (ref 1.17–86.46)
Free Kappa/Lambda Ratio: 0.11 — ABNORMAL LOW (ref 1.83–14.26)
Free Lambda Lt Chains,Ur: 1616.05 mg/L — ABNORMAL HIGH (ref 0.27–15.21)
GAMMA GLOBULIN URINE: 83.1 %
M-SPIKE %, Urine: 70.8 % — ABNORMAL HIGH
M-Spike, Mg/24 Hr: 1579 mg/24 hr — ABNORMAL HIGH
Total Protein, Urine-Ur/day: 2230 mg/24 hr — ABNORMAL HIGH (ref 30–150)
Total Protein, Urine: 82.6 mg/dL
Total Volume: 2700

## 2022-08-15 LAB — GLUCOSE, CAPILLARY
Glucose-Capillary: 86 mg/dL (ref 70–99)
Glucose-Capillary: 98 mg/dL (ref 70–99)
Glucose-Capillary: 158 mg/dL — ABNORMAL HIGH (ref 70–99)
Glucose-Capillary: 107 mg/dL — ABNORMAL HIGH (ref 70–99)

## 2022-08-15 LAB — URIC ACID: Uric Acid, Serum: 9.3 mg/dL — ABNORMAL HIGH (ref 2.5–7.1)

## 2022-08-15 LAB — BRAIN NATRIURETIC PEPTIDE: B Natriuretic Peptide: 246.3 pg/mL — ABNORMAL HIGH (ref 0.0–100.0)

## 2022-08-15 LAB — CALCIUM, IONIZED: Calcium, Ionized, Serum: 7.4 mg/dL — ABNORMAL HIGH (ref 4.5–5.6)

## 2022-08-15 LAB — LACTATE DEHYDROGENASE: LDH: 173 U/L (ref 98–192)

## 2022-08-15 LAB — MAGNESIUM: Magnesium: 2.1 mg/dL (ref 1.7–2.4)

## 2022-08-15 MED ORDER — SODIUM CHLORIDE 0.9 % IV SOLN
6.0000 mg | Freq: Once | INTRAVENOUS | Status: AC
  Administered 2022-08-16: 6 mg via INTRAVENOUS
  Filled 2022-08-15: qty 4

## 2022-08-15 MED ORDER — SENNOSIDES-DOCUSATE SODIUM 8.6-50 MG PO TABS
1.0000 | ORAL_TABLET | Freq: Two times a day (BID) | ORAL | Status: DC
  Administered 2022-08-16 – 2022-08-21 (×6): 1 via ORAL
  Filled 2022-08-15 (×9): qty 1

## 2022-08-15 NOTE — Progress Notes (Signed)
Physical Therapy Treatment Patient Details Name: Rachael Lee MRN: [id number] DOB: 07-31-1965 Today's Date: 08/15/2022   History of Present Illness Rachael Lee is a 57 y/o woman presented with abdominal pain for about 1 day prior to admission. She has had back pain for about a week and a half since she came to [place] from [place]. She is visiting family. Found to have  calcium greater than 15 and acute kidney injury. PHMx:expressive aphasia since having a stroke 2 years ago    PT Comments    Pt greeted resting in bed and agreeable to session with good progress towards acute goals. Pt able to come to sit EOB with light min A to elevate trunk and transfers to stand with RW support with min guard assist. Pt requiring grossly min guard assist for gait in hall with RW support with light cues for RW proximity and pacing. Pt continues to be limited by residual R sided hemi-weakness, decreased activity tolerance and low back pain. Pt son present and supportive throughout session. Current plan remains appropriate to address deficits and maximize functional independence and decrease caregiver burden. Pt continues to benefit from skilled PT services to progress toward functional mobility goals.    Recommendations for follow up therapy are one component of a multi-disciplinary discharge planning process, led by the attending physician.  Recommendations may be updated based on patient status, additional functional criteria and insurance authorization.  Follow Up Recommendations       Assistance Recommended at Discharge PRN  Patient can return home with the following A little help with bathing/dressing/bathroom;Assistance with cooking/housework;Assist for transportation;Help with stairs or ramp for entrance   Equipment Recommendations  BSC/3in1 (tub bench)    Recommendations for Other Services       Precautions / Restrictions Precautions Precautions: Fall Restrictions Weight Bearing  Restrictions: No     Mobility  Bed Mobility   Bed Mobility: Sit to Supine, Supine to Sit       Sit to supine: Min assist   General bed mobility comments: increased time and effort, light assist to return BLEs to bed    Transfers   Equipment used: Rolling walker (2 wheels)   Sit to Stand: Min assist, Min guard           General transfer comment: min guard for safety, light min A to steady    Ambulation/Gait Ambulation/Gait assistance: Min guard Gait Distance (Feet): 150 Feet Assistive device: Rolling walker (2 wheels) Gait Pattern/deviations: Step-through pattern, Decreased stride length, Trunk flexed Gait velocity: decr     General Gait Details: slow guaraded gait with RW support, no LOB and fair stability, decrased R foot clearance   Stairs             Wheelchair Mobility    Modified Rankin (Stroke Patients Only)       Balance                                            Cognition Arousal/Alertness: Awake/alert Behavior During Therapy: WFL for tasks assessed/performed Overall Cognitive Status: Difficult to assess                                          Exercises General Exercises - Lower Extremity Long Arc Quad: AROM, Right, Left,  10 reps, Seated Hip Flexion/Marching: AROM, Right, Left, 10 reps, Seated    General Comments        Pertinent Vitals/Pain Pain Assessment Pain Assessment: Faces Faces Pain Scale: Hurts little more Pain Location: low back Pain Descriptors / Indicators: Guarding, Grimacing Pain Intervention(s): Monitored during session, Limited activity within patient's tolerance    Home Living                          Prior Function            PT Goals (current goals can now be found in the care plan section) Acute Rehab PT Goals PT Goal Formulation: With patient Time For Goal Achievement: 08/27/22 Progress towards PT goals: Progressing toward goals    Frequency     Min 3X/week      PT Plan      Co-evaluation              AM-PAC PT "6 Clicks" Mobility   Outcome Measure  Help needed turning from your back to your side while in a flat bed without using bedrails?: A Little Help needed moving from lying on your back to sitting on the side of a flat bed without using bedrails?: A Little Help needed moving to and from a bed to a chair (including a wheelchair)?: A Little Help needed standing up from a chair using your arms (e.g., wheelchair or bedside chair)?: A Little Help needed to walk in hospital room?: A Little Help needed climbing 3-5 steps with a railing? : Total 6 Click Score: 16    End of Session Equipment Utilized During Treatment: Gait belt Activity Tolerance: Patient tolerated treatment well Patient left: in bed;with call bell/phone within reach;with bed alarm set Nurse Communication: Mobility status PT Visit Diagnosis: Muscle weakness (generalized) (M62.81)     Time: 1610-9604 PT Time Calculation (min) (ACUTE ONLY): 25 min  Charges:  $Gait Training: 8-22 mins $Therapeutic Activity: 8-22 mins                     Myrella Fahs R. PTA Acute Rehabilitation Services Office: [phone]    Catalina Antigua 08/15/2022, 4:47 PM

## 2022-08-15 NOTE — Progress Notes (Addendum)
Neck right-sided hemiparesis right arm weaker than the right leg, some expressive aphasia which is chronic and at baseline,                                                                   PROGRESS NOTE                                                                                                                                                                                                             Patient Demographics:    Rachael Lee, is a 57 y.o. female, DOB - 1966/03/01, ZOX:[id number]  Outpatient Primary MD for the patient is [name], MD    LOS - 2  Admit date - 08/12/2022    Chief Complaint  Patient presents with   Abdominal Pain       Brief Narrative (HPI from H&P)    o who presented with abdominal pain for about 1 day prior to admission. She has had back pain for about a week and a half since she came to [place] from [place]. She is visiting family. She has not had nausea, vomiting, diarrhea. She denies leg numbness. Over the last day she has been walking less due to pain radiating down her legs. 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 She does not have braces for her contractures. Her son relates that she has been very thirsty recently and has been drinking a lot of water. She has not been confused. In the emergency department her workup was notable for a calcium greater than 15 and acute kidney injury. PCCM was consulted for evaluation and management.   Was admitted by PCCM seen by nephrology, she was transferred to [facility] under my service on 08/14/2022.   Subjective:   Patient in bed, appears comfortable, denies any headache, no fever, no chest pain or pressure, no shortness of breath , no abdominal pain. No new focal weakness, Low back pain.   Assessment  & Plan :     Hypercalcemia causing abdominal pain, dehydration and renal failure with lytic spine lesions and back pain -   She  has been seen by PCCM and Renal, managing hypercalcemia being hydrated, received salmon calcitonin, monitor CMP closely, suspicious  for multiple myeloma, bone biopsy done by IR on 08/14/2022 follow results.  Clinically stable oncology also consulted, skeletal survey underway.  Lytic spine lesions,, compression fracture, back and leg pain. Concern for malignancy.   Bone biopsy done by IR 08/14/22, continue pain control,  SPEP and UPEP per nephrology so far looks highly suspicious for multiple myeloma, oncology consulted, continue supportive care and monitor biopsy results.   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  PT OT and speech eval.  On DAPT, back on DAPT at home dose along with Zetia and statin.  Dyslipidemia.  Continue statin and Zetia combination.  Hypertension.  Home dose Norvasc and Lopressor.      Condition - Extremely Guarded  Family Communication  :  son [name] [phone]  on 08/14/22  Code Status :  Full  Consults  :  PCCM, Renal, IR, Onc  PUD Prophylaxis :    Procedures  :     CT Biopsy 08/14/22 - 1. Successful CT-guided focal bone core needle biopsy of lytic lesion left posterior ilium. 2. Successful CT-guided bone marrow aspiration and core biopsy of the right posterior ilium  CT Head - 1. No acute intracranial abnormality. 2. Large chronic left MCA distribution infarct, with additional small remote right cerebellar infarct. 3. 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  CT - 1. No acute intra-abdominal pathology identified. 2. Moderate coronary artery calcification. 3. Moderate asymmetric right renal cortical atrophy. 4. Mild bilateral nonobstructing nephrolithiasis. No urolithiasis. No hydronephrosis. 5. 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 6. Scattered lytic lesions throughout the lumbar spine and pelvis suspicious for osseous  metastatic disease or myeloma. Minimally displaced pathologic fracture of the inferior right pubic ramus. 7. Age indeterminate superior endplate fracture of T9 with no significant loss of height. Aortic Atherosclerosis       Disposition Plan  :    Status is: Inpatient   DVT Prophylaxis  :    enoxaparin (LOVENOX) injection 30 mg Start: 08/14/22 1400 SCDs Start: 08/13/22 0057    Lab Results  Component Value Date   PLT 279 08/15/2022    Diet :  Diet Order             Diet heart healthy/carb modified Room service appropriate? Yes; Fluid consistency: Thin  Diet effective now                    Inpatient Medications  Scheduled Meds:  amLODipine  5 mg Oral Daily   aspirin EC  81 mg Oral Daily   atorvastatin  80 mg Oral QHS   Chlorhexidine Gluconate Cloth  6 each Topical Daily   docusate sodium  200 mg Oral BID   enoxaparin (LOVENOX) injection  30 mg Subcutaneous Q24H   ezetimibe  10 mg Oral Daily   ferrous sulfate  325 mg Oral Daily   fluticasone furoate-vilanterol  1 puff Inhalation Daily   insulin aspart  0-6 Units Subcutaneous TID WC   metoprolol tartrate  25 mg Oral BID   polyethylene glycol  17 g Oral BID   ticagrelor  90 mg Oral BID   Continuous Infusions:  sodium chloride 100 mL/hr at 08/15/22 0257   PRN Meds:.acetaminophen, hydrALAZINE, melatonin, ondansetron (ZOFRAN) IV, mouth rinse, oxyCODONE  Antibiotics  :    Anti-infectives (From admission, onward)    None         Objective:  Vitals:   08/15/22 0031 08/15/22 0421 08/15/22 0423 08/15/22 0735  BP: (!) 142/68 (!) 126/57  136/64  Pulse: 70 72  71  Resp: 13 14  13   Temp: 97.6 F (36.4 C) 98.3 F (36.8 C)  98.1 F (36.7 C)  TempSrc: Oral Oral  Oral  SpO2: 98%   97%  Weight:   87 kg   Height:        Wt Readings from Last 3 Encounters:  08/15/22 87 kg     Intake/Output Summary (Last 24 hours) at 08/15/2022 0901 Last data filed at 08/15/2022 0300 Gross per 24 hour  Intake 1996.8 ml   Output 1850 ml  Net 146.8 ml     Physical Exam  Awake Alert, Chr R arm >> R leg weakness, expressive aphasia [place].AT,PERRAL Supple Neck, No JVD,   Symmetrical Chest wall movement, Good air movement bilaterally, CTAB RRR,No Gallops,Rubs or new Murmurs,  +ve B.Sounds, Abd Soft, No tenderness,   No Cyanosis, Clubbing or edema        Data Review:    Recent Labs  Lab 08/12/22 2021 08/13/22 0248 08/14/22 0630 08/15/22 0536  WBC 10.8* 10.0 9.2 7.7  HGB 10.5* 10.3* 9.4* 9.0*  HCT 33.7* 32.1* 27.7* 27.3*  PLT 347 361 313 279  MCV 91.1 87.7 85.2 85.8  MCH 28.4 28.1 28.9 28.3  MCHC 31.2 32.1 33.9 33.0  RDW 13.9 13.7 13.6 13.7  LYMPHSABS 4.9*  --  1.3 2.7  MONOABS 0.6  --  0.6 0.6  EOSABS 0.5  --  0.1 0.0  BASOSABS 0.0  --  0.0 0.0    Recent Labs  Lab 08/12/22 2021 08/13/22 0248 08/13/22 1308 08/13/22 1817 08/14/22 0629 08/14/22 1116 08/15/22 0536  NA 136 138 138 138  --  139 137  K 4.0 4.1 4.3 4.5  --  4.0 3.7  CL 101 107 103 105  --  108 110  CO2 22 22 19* 21*  --  22 23  ANIONGAP 13 9 16* 12  --  9 4*  GLUCOSE 103* 110* 190* 139*  --  112* 88  BUN 43* 37* 39* 37*  --  32* 27*  CREATININE 2.48* 2.30* 2.27* 2.16*  --  1.98* 1.73*  AST 15  --   --   --   --  17 14*  ALT 15  --   --   --   --  13 12  ALKPHOS 62  --   --   --   --  49 46  BILITOT 0.7  --   --   --   --  0.6 0.6  ALBUMIN 3.6  --  3.3*  --   --  3.1* 2.8*  INR  --   --   --   --  1.2  --   --   HGBA1C  --   --  7.4*  --   --   --   --   BNP  --   --   --   --   --   --  246.3*  MG 1.9 1.8  --   --   --   --  2.1  CALCIUM >15.0* 13.1* 12.3* 12.0*  --  10.4* 9.7      Recent Labs  Lab 08/12/22 2021 08/13/22 0248 08/13/22 1308 08/13/22 1817 08/14/22 0629 08/14/22 1116 08/15/22 0536  INR  --   --   --   --  1.2  --   --  HGBA1C  --   --  7.4*  --   --   --   --   BNP  --   --   --   --   --   --  246.3*  MG 1.9 1.8  --   --   --   --  2.1  CALCIUM >15.0* 13.1* 12.3* 12.0*  --  10.4* 9.7      Lab Results  Component Value Date   HGBA1C 7.4 (H) 08/13/2022      Radiology Reports CT BIOPSY  Result Date: 08/14/2022 INDICATION: Lytic bone lesion of the hip, concern for possible multiple myeloma/plasmacytoma EXAM: 1. CT-guided bone lesion biopsy of the left ilium 2. CT-guided bone marrow aspiration and core biopsy MEDICATIONS: None. ANESTHESIA/SEDATION: Moderate (conscious) sedation was employed during this procedure. A total of Versed 1 mg and Fentanyl 50 mcg was administered intravenously. Moderate Sedation Time: 19 minutes. The patient's level of consciousness and vital signs were monitored continuously by radiology nursing throughout the procedure under my direct supervision. FLUOROSCOPY TIME:  N/a COMPLICATIONS: None immediate. PROCEDURE: Informed written consent was obtained from the patient after a thorough discussion of the procedural risks, benefits and alternatives. All questions were addressed. Maximal Sterile Barrier Technique was utilized including caps, mask, sterile gowns, sterile gloves, sterile drape, hand hygiene and skin antiseptic. A timeout was performed prior to the initiation of the procedure. The patient was placed prone on the exam table. Limited CT of the pelvis was performed for planning purposes. This demonstrated lytic lesion in the posterior left ilium. Skin entry site was marked, and the overlying skin was prepped and draped in the standard sterile fashion. Local analgesia was obtained with 1% lidocaine. Using intermittent CT fluoroscopy, a 11 gauge bone biopsy needle was advanced towards the identified lesion and biopsy performed. Specimens were submitted in formalin to pathology for further handling. Attention was then turned to the contralateral side for bone marrow aspiration and core biopsy. Limited CT of the pelvis was performed for planning purposes. Skin entry site was marked, and the overlying skin was prepped and draped in the standard sterile fashion.  Local analgesia was obtained with 1% lidocaine. Using CT guidance, an 11 gauge needle was advanced just deep to the cortex of the right posterior ilium. Subsequently, bone marrow aspiration and core biopsy were performed. Specimens were submitted to lab/pathology for handling. Hemostasis was achieved with manual pressure, and a clean dressing was placed. The patient tolerated the procedure well without immediate complication. IMPRESSION: 1. Successful CT-guided focal bone core needle biopsy of lytic lesion left posterior ilium. 2. Successful CT-guided bone marrow aspiration and core biopsy of the right posterior ilium. Electronically Signed   By: Olive Bass M.D.   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 CT-guided bone lesion biopsy of the left ilium 2. CT-guided bone marrow aspiration and core biopsy MEDICATIONS: None. ANESTHESIA/SEDATION: Moderate (conscious) sedation was employed during this procedure. A total of Versed 1 mg and Fentanyl 50 mcg was administered intravenously. Moderate Sedation Time: 19 minutes. The patient's level of consciousness and vital signs were monitored continuously by radiology nursing throughout the procedure under my direct supervision. FLUOROSCOPY TIME:  N/a COMPLICATIONS: None immediate. PROCEDURE: Informed written consent was obtained from the patient after a thorough discussion of the procedural risks, benefits and alternatives. All questions were addressed. Maximal Sterile Barrier Technique was utilized  including caps, mask, sterile gowns, sterile gloves, sterile drape, hand hygiene and skin antiseptic. A timeout was performed prior to the initiation of the procedure. The patient was placed prone on the exam table. Limited CT of the pelvis was performed for planning purposes. This demonstrated lytic lesion in the posterior left ilium. Skin  entry site was marked, and the overlying skin was prepped and draped in the standard sterile fashion. Local analgesia was obtained with 1% lidocaine. Using intermittent CT fluoroscopy, a 11 gauge bone biopsy needle was advanced towards the identified lesion and biopsy performed. Specimens were submitted in formalin to pathology for further handling. Attention was then turned to the contralateral side for bone marrow aspiration and core biopsy. Limited CT of the pelvis was performed for planning purposes. Skin entry site was marked, and the overlying skin was prepped and draped in the standard sterile fashion. Local analgesia was obtained with 1% lidocaine. Using CT guidance, an 11 gauge needle was advanced just deep to the cortex of the right posterior ilium. Subsequently, bone marrow aspiration and core biopsy were performed. Specimens were submitted to lab/pathology for handling. Hemostasis was achieved with manual pressure, and a clean dressing was placed. The patient tolerated the procedure well without immediate complication. IMPRESSION: 1. Successful CT-guided focal bone core needle biopsy of lytic lesion left posterior ilium. 2. Successful CT-guided bone marrow aspiration and core biopsy of the right posterior ilium. Electronically Signed   By: Olive Bass M.D.   On: 08/14/2022 13:31   DG Abd Portable 1V  Result Date: 08/14/2022 CLINICAL DATA:  Constipation EXAM: PORTABLE ABDOMEN - 1 VIEW COMPARISON:  None Available. FINDINGS: Scattered stool throughout colon, grossly normal stool burden. Small amount gas and stool in rectum. No bowel dilatation or bowel wall thickening. Bones demineralized. IMPRESSION: Nonobstructive bowel gas pattern. Electronically Signed   By: Ulyses Southward M.D.   On: 08/14/2022 12:27   CT Head Wo Contrast  Result Date: 08/12/2022 CLINICAL DATA:  Initial evaluation for mental status change. EXAM: CT HEAD WITHOUT CONTRAST TECHNIQUE: Contiguous axial images were obtained from the  base of the skull through the vertex without intravenous contrast. RADIATION DOSE REDUCTION: This exam was performed according to the departmental dose-optimization program which includes automated exposure control, adjustment of the mA and/or kV according to patient size and/or use of iterative reconstruction technique. COMPARISON:  None Available. FINDINGS: Brain: Large area of encephalomalacia involving the left cerebral hemisphere, consistent with a chronic left MCA distribution infarct. Probable superimposed areas of scattered laminar necrosis within this area. Additional small remote right cerebellar infarct. No acute intracranial hemorrhage. No acute large vessel territory infarct. No mass lesion or midline shift. No hydrocephalus or extra-axial fluid collection. Vascular: No abnormal hyperdense vessel. Scattered calcified atherosclerosis present at the skull base. Skull: Scalp soft tissues and calvarium demonstrate no acute finding. Sinuses/Orbits: Globes and orbital soft tissues within normal limits. 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 Additional chronic mucoperiosteal thickening present about the sphenoethmoidal and maxillary sinuses. No mastoid effusion. Other: None. IMPRESSION: 1. No acute intracranial abnormality. 2. Large chronic left MCA distribution infarct, with additional small remote right cerebellar infarct. 3. 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 Electronically Signed   By: Rise Mu M.D.   On: 08/12/2022 23:10   CT ABDOMEN PELVIS WO CONTRAST  Result Date: 08/12/2022 CLINICAL DATA:  Abdominal pain, acute, nonlocalized EXAM: CT ABDOMEN AND PELVIS WITHOUT CONTRAST TECHNIQUE: Multidetector CT  imaging of the abdomen and pelvis was performed following the standard protocol without IV contrast. RADIATION DOSE REDUCTION: This exam was performed according to the departmental  dose-optimization program which includes automated exposure control, adjustment of the mA and/or kV according to patient size and/or use of iterative reconstruction technique. COMPARISON:  None Available. FINDINGS: Lower chest: Moderate coronary artery calcification. Right coronary artery stenting has been performed. Cardiac size within normal limits. No pericardial effusion. Visualized lung bases are clear. Hepatobiliary: No focal liver abnormality is seen. No gallstones, gallbladder wall thickening, or biliary dilatation. Pancreas: Unremarkable Spleen: Unremarkable Adrenals/Urinary Tract: The adrenal glands are unremarkable. The kidneys are normal in position. Moderate asymmetric right renal cortical atrophy. Multiple nonobstructing calculi are seen within the kidneys bilaterally measuring up to 5 mm within the lower pole the left kidney. No hydronephrosis. No ureteral calculi. Multiple simple cortical cysts are seen within the kidneys bilaterally for which no follow-up imaging is recommended. The bladder is unremarkable. Stomach/Bowel: Stomach is within normal limits. Appendix appears normal. No evidence of bowel wall thickening, distention, or inflammatory changes. Vascular/Lymphatic: Aortic atherosclerosis. No enlarged abdominal or pelvic lymph nodes. Reproductive: Dermoid cyst within the right ovary identified demonstrating macroscopic fat measuring 7.2 x 9.4 x 7.7 cm. Uterus and left adnexa are unremarkable. Other: No abdominal wall hernia. Musculoskeletal: Degenerative changes are seen within the lumbar spine. There are superimposed scattered lytic lesions noted throughout the lumbar spine and pelvis, the largest of which are is seen within the left iliac spine and axiall image # 56/3 measuring 15 mm, suspicious for osseous metastatic disease or myeloma. Pathologic fracture noted involving the lytic lesion bobbing the inferior right pubic ramus. Age indeterminate superior endplate fracture T9 with no  significant loss of height. IMPRESSION: 1. No acute intra-abdominal pathology identified. 2. Moderate coronary artery calcification. 3. Moderate asymmetric right renal cortical atrophy. 4. Mild bilateral nonobstructing nephrolithiasis. No urolithiasis. No hydronephrosis. 5. 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 6. Scattered lytic lesions throughout the lumbar spine and pelvis suspicious for osseous metastatic disease or myeloma. Minimally displaced pathologic fracture of the inferior right pubic ramus. 7. Age indeterminate superior endplate fracture of T9 with no significant loss of height. Aortic Atherosclerosis (ICD10-I70.0). Electronically Signed   By: Helyn Numbers M.D.   On: 08/12/2022 23:06      Signature  -   Susa Raring M.D on 08/15/2022 at 9:01 AM   -  To page go to [email or web address]

## 2022-08-15 NOTE — Progress Notes (Addendum)
Inpatient Rehab Admissions Coordinator:   Note oncology consult pending and skeletal survey underway today.  Will continue to follow.  Working to [employer].   Addendum: Verified pt has [facility].  Will need prior auth and likely single case agreement for coverage.   Estill Dooms, PT, DPT Admissions Coordinator [phone] 08/15/22  10:19 AM

## 2022-08-15 NOTE — Plan of Care (Signed)

## 2022-08-15 NOTE — Progress Notes (Signed)
Occupational Therapy Treatment Patient Details Name: Rachael Lee MRN: [id number] DOB: [birth year] Today's Date: 08/15/2022   History of present illness Rachael Lee is a 57 y/o woman presented with abdominal pain for about 1 day prior to admission. She has had back pain for about a week and a half since she came to [place] from [place]. She is visiting family. Found to have  calcium greater than 15 and acute kidney injury. 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 Once at recliner she had the 2nd time she needed to use bathroom (so urgent she could not stand and turn to 3n1 in time, so had to use bedpan in recliner). She will continue to benefit from acute OT with follow up from intensive inpatient follow up therapy, >3 hours/day. Loose stools were a hindrance to progressing activity today.    Recommendations for follow up therapy are one component of a multi-disciplinary discharge planning process, led by the attending physician.  Recommendations may be updated based on patient status, additional functional criteria and insurance authorization.    Assistance Recommended at Discharge Frequent or constant Supervision/Assistance  Patient can return home with the following  A little help with walking and/or transfers;A lot of help with bathing/dressing/bathroom;Assistance with cooking/housework;Assist for transportation;Help with stairs or ramp for entrance;Direct supervision/assist for financial management;Direct supervision/assist for medications management   Equipment Recommendations  BSC/3in1 (son to get tub bench)    Recommendations for Other Services Rehab consult    Precautions / Restrictions Precautions Precautions:  Fall Restrictions Weight Bearing Restrictions: No       Mobility Bed Mobility Overal bed mobility: Needs Assistance Bed Mobility: Supine to Sit     Supine to sit: Min guard, HOB elevated     General bed mobility comments: increased time and effort    Transfers Overall transfer level: Needs assistance Equipment used:  (Bil HHA with me standing in front of her) Transfers: Sit to/from Stand, Bed to chair/wheelchair/BSC Sit to Stand: Min assist Stand pivot transfers: Min assist               Balance Overall balance assessment: Needs assistance Sitting-balance support: No upper extremity supported, Feet supported Sitting balance-Leahy Scale: Good     Standing balance support: Bilateral upper extremity supported Standing balance-Leahy Scale: Poor                             ADL either performed or assessed with clinical judgement   ADL Overall ADL's : Needs assistance/impaired                         Toilet Transfer: Minimal assistance;Stand-pivot Toilet Transfer Details (indicate cue type and reason): Bil HHA simulated bed>recliner Toileting- Clothing Manipulation and Hygiene: Total assistance Toileting - Clothing Manipulation Details (indicate cue type and reason): At bed level and standing for hygiene due to pt having bouts of diarrhea today            Extremity/Trunk Assessment Upper Extremity Assessment Upper Extremity Assessment: RUE deficits/detail RUE Deficits / Details: old CVA with decreased AROM all joints but can open and close hand when she concentrates on it.  RUE Coordination: decreased fine motor;decreased gross motor             Cognition Arousal/Alertness: Awake/alert Behavior During Therapy: WFL for tasks assessed/performed Overall Cognitive Status: Difficult to assess                                                     Pertinent Vitals/ Pain       Pain Assessment Pain Assessment:  No/denies pain         Frequency  Min 2X/week        Progress Toward Goals  OT Goals(current goals can now be found in the care plan section)  Progress towards OT goals: Progressing toward goals  Acute Rehab OT Goals Patient Stated Goal: to get better and go to live with son OT Goal Formulation: With patient Time For Goal Achievement: 08/27/22 Potential to Achieve Goals: Good  Plan Discharge plan remains appropriate       AM-PAC OT "6 Clicks" Daily Activity     Outcome Measure   Help from another person eating meals?: A Little Help from another person taking care of personal grooming?: A Little Help from another person toileting, which includes using toliet, bedpan, or urinal?: A Lot Help from another person bathing (including washing, rinsing, drying)?: A Lot Help from another person to put on and taking off regular upper body clothing?: A Little Help from another person to put on and taking off regular lower body clothing?: A Lot 6 Click Score: 15    End of Session Equipment Utilized During Treatment: Gait belt  OT Visit Diagnosis: Unsteadiness on feet (R26.81);Other abnormalities of gait and mobility (R26.89);Muscle weakness (generalized) (M62.81);Low vision, both eyes (H54.2);Other symptoms and signs involving cognitive function;Hemiplegia and hemiparesis;Cognitive communication deficit (R41.841) Symptoms and signs involving cognitive functions: Cerebral infarction Hemiplegia - Right/Left: Right Hemiplegia - dominant/non-dominant: Dominant Hemiplegia - caused by: Cerebral infarction   Activity Tolerance Patient tolerated treatment well   Patient Left in chair;with call bell/phone within reach;with chair alarm set           Time: 1315-1351 OT Time Calculation (min): 36 min  Charges: OT General Charges $OT Visit: 1 Visit OT Treatments $Self Care/Home Management : 23-37 mins  Lindon Romp OT Acute Rehabilitation Services Office [phone]    Evette Georges 08/15/2022, 4:29 PM

## 2022-08-15 NOTE — Progress Notes (Signed)
[facility] Progress Note  Name: Rachael Lee MRN: [id number] DOB: [birth year]  Chief Complaint:  Abdominal pain and back pain   Subjective:  She had 1.9 liters UOP over 4/25.  She has been on normal saline at 100 ml/hr - these are off at the time of my exam.   She had a bone lesion biopsy as well as a bone marrow biopsy on 4/25.  She points to her foley and makes sounds and motions and I asked her if she needed to have a bowel movement; she said yes several times.  I spoke with the nurse's station and the charge RN went into her room to assist her  Review of systems:  She states that she is a little short of breath.  She denies chest pain  Denies n/v.   She has had a foley in  ------------- Background on referral:  Rachael Lee is a 57 y.o. female with a history of HTN, DM, prior CVA, and asthma who presented to the hospital with abdominal pain as well as back pain for the past week and a half.  She is here visiting family and lives out of state in [place].  I spoke with her son on the phone in the room via speakerphone.  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  She is still having the abdominal pain.  She denies any NSAID use.  She does take aspirin daily OTC.  She was found to have hypercalcemia as well as acute kidney injury.  Her presenting calcium was greater than 15. high. She has been given calcitonin x 2 doses and has been on normal saline.  Her creatinine is improving with hydration.  Unfortunately, other work-up has revealed scattered lytic lesions throughout the lumbar spine and pelvis suspicious for metastatic disease or myeloma.  Note that she had a stroke 2 years ago and has expressive aphasia secondary to the same as well as right-sided weakness.  Nephrology is consulted for assistance with management of hypercalcemia and AKI.  She had 1.5 liters UOP over 4/23 and has had 820 mL uop thus far today.   CT a/p without hydro;  mild bilateral non-obstructing nephrolithiasis.  Nursing states bone marrow biopsy is scheduled for tomorrow.      Intake/Output Summary (Last 24 hours) at 08/15/2022 1343 Last data filed at 08/15/2022 0300 Gross per 24 hour  Intake 1596.8 ml  Output 1850 ml  Net -253.2 ml    Vitals:  Vitals:   08/15/22 0421 08/15/22 0423 08/15/22 0735 08/15/22 1120  BP: (!) 126/57  136/64 (!) 146/68  Pulse: 72  71 75  Resp: 14  13 18   Temp: 98.3 F (36.8 C)  98.1 F (36.7 C) (!) 97.4 F (36.3 C)  TempSrc: Oral  Oral Oral  SpO2:   97% 97%  Weight:  87 kg    Height:         Physical Exam:  General:  adult female in bed in NAD  HEENT: NCAT Eyes: EOMI  Neck: sclera anicteric Heart: supple trachea midline  Lungs: clear to auscultation; normal work of breathing on room air  Abdomen: soft/nt/obese habitus/distended  Extremities: trace edema lower extremities  Skin: no rash on extremities exposed  Psych no anxiety or agitation  Neuro: patient is awake and interactive; expressive aphasia but motions with her hands and has limited speech; on gross exam patient with right sided weakness  Medications reviewed   Labs:  Latest Ref Rng & Units 08/15/2022    5:36 AM 08/14/2022   11:16 AM 08/13/2022    6:17 PM  BMP  Glucose 70 - 99 mg/dL 88  161  096   BUN 6 - 20 mg/dL 27  32  37   Creatinine 0.44 - 1.00 mg/dL 0.45  4.09  8.11   Sodium 135 - 145 mmol/L 137  139  138   Potassium 3.5 - 5.1 mmol/L 3.7  4.0  4.5   Chloride 98 - 111 mmol/L 110  108  105   CO2 22 - 32 mmol/L 23  22  21    Calcium 8.9 - 10.3 mg/dL 9.7  91.4  78.2      Assessment/Plan:   # Hypercalcemia  - Constellation of symptoms worrisome for malignancy. PTH appropriately low.  Extremely low kappa/lamba free light chains ratio suggestive of plasma cell dyscrasia.  Note that PTHrp was sent as well.  - Continue normal saline  - Deferred bisphosphonate given renal function  - SPEP and UPEP pending.  - Would consult hem/onc     # AKI  - prerenal and improving with hydration; setting of marked hypercalcemia and concern for plasma cell dyscrasia - note baseline Cr 1.1 per 01/2021 Care Everywhere records  - Resolving with hydration.  She indicates that she is a little short of breath and fluids are off on my exam so I have discontinued the order for now    # Lytic spine lesions  - Bone marrow biopsy was performed with path pending   # HTN  - Continue amlodipine 5 mg daily for now (new med this admission)   # Hx Stroke - residual expressive aphasia and right-sided weakness - HTN control as above  - she is on aspirin and ticagrelor per primary team   # Normocytic Anemia  - in setting of malignancy   # Hyperuricemia  - Watch for TLS signs - Continue hydration  - Would consider allopurinol per primary team discretion  Disposition - would continue inpatient monitoring   Estanislado Emms, MD 08/15/2022 2:05 PM

## 2022-08-15 NOTE — Consult Note (Signed)
[facility] CONSULT NOTE  Patient Care Team: [name], MD as PCP - General (Internal Medicine)  ASSESSMENT & PLAN:  Myeloma until proven otherwise The constellation of anemia, hypercalcemia, lytic lesions and renal failure, along with very high light chain levels are diagnostic of Multiple myeloma Bone marrow biopsy is pending I discussed with patient, family and primary service She can get started with chemotherapy as inpatient next week  Hypercalcemia This is improving We can start her on pulse steroids next week  Acute renal failure Appreciate nephrology consult, improving  Hyperuricemia Will start her on rasburicase tomorrow and trend her uric acid level  Anemia Not symptomatic, observe  Expressive aphasia Due to stroke If she is not an inpatient rehab candidate, would recommend transfer to [facility] to facilitate chemotherapy administration next week  Other constipation Needs aggressive laxatives  Discharge planning Unlikely this weekend D/W primary service regarding plan for chemo next week Continue supportive care and management of renal failure and hypercalcemia Will resume care on Monday Call consult service if questions arise   The total time spent in the appointment was 80 minutes encounter with patients including review of chart and various tests results, discussions about plan of care and coordination of care plan   All questions were answered. The patient knows to call the clinic with any problems, questions or concerns. No barriers to learning was detected.  Artis Delay, MD 4/26/20244:37 PM  CHIEF COMPLAINTS/PURPOSE OF CONSULTATION:  Suspect multiple myeloma  HISTORY OF PRESENTING ILLNESS:  Rachael Lee 57 y.o. female is seen due to concerns for undiagnosed multiple myeloma She has stroke and expressive aphasia; her son is by bedside and another son over the phone They told me she planned to relocate to [facility] and has been here since 2  weeks ago She has history of stroke causing expressive aphasia and weakness Has back pain, constipation and went to ER 2 days ago She was found to have hypercalcemia and renal failure; imaging study showed lytic lesions She had bone marrow biopsy done, pending Over last 2 days, with aggressive fluid hydration and medications, her renal failure and hypercalcemia are improving. Prelim results showed very high light chain studies, all highly suggestive of diagnosis of multiple myeloma  The patient appears to understand all questions although she has expressive aphasia She denies pain today Could not recall last bowel movement  MEDICAL HISTORY:  Past Medical History:  Diagnosis Date   Asthma    DM (diabetes mellitus) (HCC)    Hypertension    Stroke (HCC)     SURGICAL HISTORY: History reviewed. No pertinent surgical history.  SOCIAL HISTORY: Social History   Socioeconomic History   Marital status: Single    Spouse name: Not on file   Number of children: Not on file   Years of education: Not on file   Highest education level: Not on file  Occupational History   Not on file  Tobacco Use   Smoking status: Former    Types: Cigarettes    Quit date: 2017    Years since quitting: 7.3   Smokeless tobacco: Not on file  Substance and Sexual Activity   Alcohol use: No   Drug use: Not on file   Sexual activity: Not on file  Other Topics Concern   Not on file  Social History Narrative   Not on file   Social Determinants of Health   Financial Resource Strain: Not on file  Food Insecurity: Not on file  Transportation Needs:  Not on file  Physical Activity: Not on file  Stress: Not on file  Social Connections: Not on file  Intimate Partner Violence: Not on file    FAMILY HISTORY: Family History  Problem Relation Age of Onset   Hypertension Mother     ALLERGIES:  has No Known Allergies.  MEDICATIONS:  Current Facility-Administered Medications  Medication Dose Route  Frequency Provider Last Rate Last Admin   acetaminophen (TYLENOL) tablet 650 mg  650 mg Oral Q6H PRN Karie Fetch P, DO   650 mg at 08/15/22 1542   amLODipine (NORVASC) tablet 5 mg  5 mg Oral Daily Leroy Sea, MD   5 mg at 08/15/22 1120   aspirin EC tablet 81 mg  81 mg Oral Daily Leroy Sea, MD   81 mg at 08/15/22 1116   atorvastatin (LIPITOR) tablet 80 mg  80 mg Oral QHS Leroy Sea, MD   80 mg at 08/14/22 2013   Chlorhexidine Gluconate Cloth 2 % PADS 6 each  6 each Topical Daily Karie Fetch P, DO   6 each at 08/15/22 1000   docusate sodium (COLACE) capsule 200 mg  200 mg Oral BID Leroy Sea, MD   200 mg at 08/15/22 1115   enoxaparin (LOVENOX) injection 30 mg  30 mg Subcutaneous Q24H Ralene Muskrat, PA-C   30 mg at 08/15/22 1454   ezetimibe (ZETIA) tablet 10 mg  10 mg Oral Daily Leroy Sea, MD   10 mg at 08/15/22 1116   ferrous sulfate tablet 325 mg  325 mg Oral Daily Leroy Sea, MD   325 mg at 08/15/22 1116   fluticasone furoate-vilanterol (BREO ELLIPTA) 100-25 MCG/ACT 1 puff  1 puff Inhalation Daily Leroy Sea, MD       hydrALAZINE (APRESOLINE) injection 10 mg  10 mg Intravenous Q6H PRN Leroy Sea, MD       insulin aspart (novoLOG) injection 0-6 Units  0-6 Units Subcutaneous TID WC Lorin Glass, MD       melatonin tablet 3 mg  3 mg Oral QHS PRN Karie Fetch P, DO       metoprolol tartrate (LOPRESSOR) tablet 25 mg  25 mg Oral BID Leroy Sea, MD   25 mg at 08/15/22 1120   ondansetron (ZOFRAN) injection 4 mg  4 mg Intravenous Q6H PRN Steffanie Dunn, DO       Oral care mouth rinse  15 mL Mouth Rinse PRN Karie Fetch P, DO       oxyCODONE (Oxy IR/ROXICODONE) immediate release tablet 5 mg  5 mg Oral Q4H PRN Karie Fetch P, DO   5 mg at 08/14/22 2024   polyethylene glycol (MIRALAX / GLYCOLAX) packet 17 g  17 g Oral BID Leroy Sea, MD   17 g at 08/14/22 2013   ticagrelor (BRILINTA) tablet 90 mg  90 mg Oral BID Leroy Sea,  MD   90 mg at 08/15/22 1116    REVIEW OF SYSTEMS:  All other systems were reviewed with the patient and are negative.  PHYSICAL EXAMINATION: ECOG PERFORMANCE STATUS: 1 - Symptomatic but completely ambulatory  Vitals:   08/15/22 0735 08/15/22 1120  BP: 136/64 (!) 146/68  Pulse: 71 75  Resp: 13 18  Temp: 98.1 F (36.7 C) (!) 97.4 F (36.3 C)  SpO2: 97% 97%   Filed Weights   08/13/22 0215 08/14/22 0500 08/15/22 0423  Weight: 180 lb 8.9 oz (81.9 kg) 189 lb 9.5 oz (  86 kg) 191 lb 12.8 oz (87 kg)    GENERAL:alert, no distress and comfortable. Expressive aphasia noted SKIN: skin color, texture, turgor are normal, no rashes or significant lesions EYES: normal, conjunctiva are pink and non-injected, sclera clear OROPHARYNX:no exudate, no erythema and lips, buccal mucosa, and tongue normal  NECK: supple, thyroid normal size, non-tender, without nodularity LYMPH:  no palpable lymphadenopathy in the cervical, axillary or inguinal LUNGS: clear to auscultation and percussion with normal breathing effort HEART: regular rate & rhythm and no murmurs and no lower extremity edema ABDOMEN:abdomen soft, non-tender and normal bowel sounds Musculoskeletal:no cyanosis of digits and no clubbing  PSYCH: alert & oriented x 3 with fluent speech NEURO: mild weakness  LABORATORY DATA:  I have reviewed the data as listed Lab Results  Component Value Date   WBC 7.7 08/15/2022   HGB 9.0 (L) 08/15/2022   HCT 27.3 (L) 08/15/2022   MCV 85.8 08/15/2022   PLT 279 08/15/2022   Recent Labs    08/12/22 2021 08/13/22 0248 08/13/22 1308 08/13/22 1817 08/14/22 1116 08/15/22 0536  NA 136   < > 138 138 139 137  K 4.0   < > 4.3 4.5 4.0 3.7  CL 101   < > 103 105 108 110  CO2 22   < > 19* 21* 22 23  GLUCOSE 103*   < > 190* 139* 112* 88  BUN 43*   < > 39* 37* 32* 27*  CREATININE 2.48*   < > 2.27* 2.16* 1.98* 1.73*  CALCIUM >15.0*   < > 12.3* 12.0* 10.4* 9.7  GFRNONAA 22*   < > 24* 26* 29* 34*  PROT 7.2   --   --   --  6.2* 5.4*  ALBUMIN 3.6  --  3.3*  --  3.1* 2.8*  AST 15  --   --   --  17 14*  ALT 15  --   --   --  13 12  ALKPHOS 62  --   --   --  49 46  BILITOT 0.7  --   --   --  0.6 0.6   < > = values in this interval not displayed.    RADIOGRAPHIC STUDIES: I have personally reviewed the radiological images as listed and agreed with the findings in the report. DG Bone Survey Met  Result Date: 08/15/2022 CLINICAL DATA:  Multiple myeloma. EXAM: METASTATIC BONE SURVEY COMPARISON:  CT scan of August 12, 2022. Radiographs of June 20, 2011. FINDINGS: Several small lucencies are noted in the skull. Several small lucencies are noted in the distal portion of the right radius and ulna. Multilevel degenerative changes are noted in lower cervical spine. Lucency is seen involving the medial portion of left acetabulum which corresponds to the lesion seen on CT scan. Multiple other smaller lucencies are seen throughout the visualized pelvis. IMPRESSION: Multiple lucencies are noted throughout the visualized pelvis, with the largest noted in the medial portion of the left acetabulum as seen on prior CT scan. Also noted are several small lucencies in the distal portion of the right radius and ulna. Several small lucencies are also noted in the skull. These are consistent with history of multiple myeloma. Electronically Signed   By: Lupita Raider M.D.   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 CT-guided bone lesion biopsy of the left ilium 2. CT-guided bone marrow  aspiration and core biopsy MEDICATIONS: None. ANESTHESIA/SEDATION: Moderate (conscious) sedation was employed during this procedure. A total of Versed 1 mg and Fentanyl 50 mcg was administered intravenously. Moderate Sedation Time: 19 minutes. The patient's level of consciousness and vital signs were monitored continuously by radiology  nursing throughout the procedure under my direct supervision. FLUOROSCOPY TIME:  N/a COMPLICATIONS: None immediate. PROCEDURE: Informed written consent was obtained from the patient after a thorough discussion of the procedural risks, benefits and alternatives. All questions were addressed. Maximal Sterile Barrier Technique was utilized including caps, mask, sterile gowns, sterile gloves, sterile drape, hand hygiene and skin antiseptic. A timeout was performed prior to the initiation of the procedure. The patient was placed prone on the exam table. Limited CT of the pelvis was performed for planning purposes. This demonstrated lytic lesion in the posterior left ilium. Skin entry site was marked, and the overlying skin was prepped and draped in the standard sterile fashion. Local analgesia was obtained with 1% lidocaine. Using intermittent CT fluoroscopy, a 11 gauge bone biopsy needle was advanced towards the identified lesion and biopsy performed. Specimens were submitted in formalin to pathology for further handling. Attention was then turned to the contralateral side for bone marrow aspiration and core biopsy. Limited CT of the pelvis was performed for planning purposes. Skin entry site was marked, and the overlying skin was prepped and draped in the standard sterile fashion. Local analgesia was obtained with 1% lidocaine. Using CT guidance, an 11 gauge needle was advanced just deep to the cortex of the right posterior ilium. Subsequently, bone marrow aspiration and core biopsy were performed. Specimens were submitted to lab/pathology for handling. Hemostasis was achieved with manual pressure, and a clean dressing was placed. The patient tolerated the procedure well without immediate complication. IMPRESSION: 1. Successful CT-guided focal bone core needle biopsy of lytic lesion left posterior ilium. 2. Successful CT-guided bone marrow aspiration and core biopsy of the right posterior ilium. Electronically Signed    By: Olive Bass M.D.   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 CT-guided bone lesion biopsy of the left ilium 2. CT-guided bone marrow aspiration and core biopsy MEDICATIONS: None. ANESTHESIA/SEDATION: Moderate (conscious) sedation was employed during this procedure. A total of Versed 1 mg and Fentanyl 50 mcg was administered intravenously. Moderate Sedation Time: 19 minutes. The patient's level of consciousness and vital signs were monitored continuously by radiology nursing throughout the procedure under my direct supervision. FLUOROSCOPY TIME:  N/a COMPLICATIONS: None immediate. PROCEDURE: Informed written consent was obtained from the patient after a thorough discussion of the procedural risks, benefits and alternatives. All questions were addressed. Maximal Sterile Barrier Technique was utilized including caps, mask, sterile gowns, sterile gloves, sterile drape, hand hygiene and skin antiseptic. A timeout was performed prior to the initiation of the procedure. The patient was placed prone on the exam table. Limited CT of the pelvis was performed for planning purposes. This demonstrated lytic lesion in the posterior left ilium. Skin entry site was marked, and the overlying skin was prepped and draped in the standard sterile fashion. Local analgesia was obtained with 1% lidocaine. Using intermittent CT fluoroscopy, a 11 gauge bone biopsy needle was advanced towards the identified lesion and biopsy performed. Specimens were submitted in formalin to pathology for further handling. Attention was then turned to the contralateral side for bone marrow aspiration and core biopsy. Limited CT  of the pelvis was performed for planning purposes. Skin entry site was marked, and the overlying skin was prepped and draped in the standard sterile fashion. Local analgesia was obtained with 1%  lidocaine. Using CT guidance, an 11 gauge needle was advanced just deep to the cortex of the right posterior ilium. Subsequently, bone marrow aspiration and core biopsy were performed. Specimens were submitted to lab/pathology for handling. Hemostasis was achieved with manual pressure, and a clean dressing was placed. The patient tolerated the procedure well without immediate complication. IMPRESSION: 1. Successful CT-guided focal bone core needle biopsy of lytic lesion left posterior ilium. 2. Successful CT-guided bone marrow aspiration and core biopsy of the right posterior ilium. Electronically Signed   By: Olive Bass M.D.   On: 08/14/2022 13:31   DG Abd Portable 1V  Result Date: 08/14/2022 CLINICAL DATA:  Constipation EXAM: PORTABLE ABDOMEN - 1 VIEW COMPARISON:  None Available. FINDINGS: Scattered stool throughout colon, grossly normal stool burden. Small amount gas and stool in rectum. No bowel dilatation or bowel wall thickening. Bones demineralized. IMPRESSION: Nonobstructive bowel gas pattern. Electronically Signed   By: Ulyses Southward M.D.   On: 08/14/2022 12:27   CT Head Wo Contrast  Result Date: 08/12/2022 CLINICAL DATA:  Initial evaluation for mental status change. EXAM: CT HEAD WITHOUT CONTRAST TECHNIQUE: Contiguous axial images were obtained from the base of the skull through the vertex without intravenous contrast. RADIATION DOSE REDUCTION: This exam was performed according to the departmental dose-optimization program which includes automated exposure control, adjustment of the mA and/or kV according to patient size and/or use of iterative reconstruction technique. COMPARISON:  None Available. FINDINGS: Brain: Large area of encephalomalacia involving the left cerebral hemisphere, consistent with a chronic left MCA distribution infarct. Probable superimposed areas of scattered laminar necrosis within this area. Additional small remote right cerebellar infarct. No acute intracranial  hemorrhage. No acute large vessel territory infarct. No mass lesion or midline shift. No hydrocephalus or extra-axial fluid collection. Vascular: No abnormal hyperdense vessel. Scattered calcified atherosclerosis present at the skull base. Skull: Scalp soft tissues and calvarium demonstrate no acute finding. Sinuses/Orbits: Globes and orbital soft tissues within normal limits. 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 Additional chronic mucoperiosteal thickening present about the sphenoethmoidal and maxillary sinuses. No mastoid effusion. Other: None. IMPRESSION: 1. No acute intracranial abnormality. 2. Large chronic left MCA distribution infarct, with additional small remote right cerebellar infarct. 3. 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 Electronically Signed   By: Rise Mu M.D.   On: 08/12/2022 23:10   CT ABDOMEN PELVIS WO CONTRAST  Result Date: 08/12/2022 CLINICAL DATA:  Abdominal pain, acute, nonlocalized EXAM: CT ABDOMEN AND PELVIS WITHOUT CONTRAST TECHNIQUE: Multidetector CT imaging of the abdomen and pelvis was performed following the standard protocol without IV contrast. RADIATION DOSE REDUCTION: This exam was performed according to the departmental dose-optimization program which includes automated exposure control, adjustment of the mA and/or kV according to patient size and/or use of iterative reconstruction technique. COMPARISON:  None Available. FINDINGS: Lower chest: Moderate coronary artery calcification. Right coronary artery stenting has been performed. Cardiac size within normal limits. No pericardial effusion. Visualized lung bases are clear. Hepatobiliary: No focal liver abnormality is seen. No gallstones, gallbladder wall thickening, or biliary dilatation. Pancreas: Unremarkable Spleen: Unremarkable Adrenals/Urinary Tract: The adrenal glands are unremarkable. The kidneys are  normal in position. Moderate asymmetric right renal cortical atrophy. Multiple nonobstructing calculi are seen  within the kidneys bilaterally measuring up to 5 mm within the lower pole the left kidney. No hydronephrosis. No ureteral calculi. Multiple simple cortical cysts are seen within the kidneys bilaterally for which no follow-up imaging is recommended. The bladder is unremarkable. Stomach/Bowel: Stomach is within normal limits. Appendix appears normal. No evidence of bowel wall thickening, distention, or inflammatory changes. Vascular/Lymphatic: Aortic atherosclerosis. No enlarged abdominal or pelvic lymph nodes. Reproductive: Dermoid cyst within the right ovary identified demonstrating macroscopic fat measuring 7.2 x 9.4 x 7.7 cm. Uterus and left adnexa are unremarkable. Other: No abdominal wall hernia. Musculoskeletal: Degenerative changes are seen within the lumbar spine. There are superimposed scattered lytic lesions noted throughout the lumbar spine and pelvis, the largest of which are is seen within the left iliac spine and axiall image # 56/3 measuring 15 mm, suspicious for osseous metastatic disease or myeloma. Pathologic fracture noted involving the lytic lesion bobbing the inferior right pubic ramus. Age indeterminate superior endplate fracture T9 with no significant loss of height. IMPRESSION: 1. No acute intra-abdominal pathology identified. 2. Moderate coronary artery calcification. 3. Moderate asymmetric right renal cortical atrophy. 4. Mild bilateral nonobstructing nephrolithiasis. No urolithiasis. No hydronephrosis. 5. 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 6. Scattered lytic lesions throughout the lumbar spine and pelvis suspicious for osseous metastatic disease or myeloma. Minimally displaced pathologic fracture of the inferior right pubic ramus. 7. Age indeterminate superior endplate fracture of T9 with no significant loss of height. Aortic Atherosclerosis (ICD10-I70.0). Electronically  Signed   By: Helyn Numbers M.D.   On: 08/12/2022 23:06

## 2022-08-16 DIAGNOSIS — N179 Acute kidney failure, unspecified: Secondary | ICD-10-CM | POA: Diagnosis not present

## 2022-08-16 LAB — COMPREHENSIVE METABOLIC PANEL
ALT: 16 U/L (ref 0–44)
AST: 13 U/L — ABNORMAL LOW (ref 15–41)
Albumin: 2.7 g/dL — ABNORMAL LOW (ref 3.5–5.0)
Alkaline Phosphatase: 47 U/L (ref 38–126)
Anion gap: 7 (ref 5–15)
BUN: 26 mg/dL — ABNORMAL HIGH (ref 6–20)
CO2: 22 mmol/L (ref 22–32)
Calcium: 10 mg/dL (ref 8.9–10.3)
Chloride: 109 mmol/L (ref 98–111)
Creatinine, Ser: 1.89 mg/dL — ABNORMAL HIGH (ref 0.44–1.00)
GFR, Estimated: 30 mL/min — ABNORMAL LOW (ref >60)
Glucose, Bld: 91 mg/dL (ref 70–99)
Potassium: 3.9 mmol/L (ref 3.5–5.1)
Sodium: 138 mmol/L (ref 135–145)
Total Bilirubin: 0.8 mg/dL (ref 0.3–1.2)
Total Protein: 5.5 g/dL — ABNORMAL LOW (ref 6.5–8.1)

## 2022-08-16 LAB — CBC WITH DIFFERENTIAL/PLATELET
Abs Immature Granulocytes: 0.02 10*3/uL (ref 0.00–0.07)
Basophils Absolute: 0 10*3/uL (ref 0.0–0.1)
Basophils Relative: 0 %
Eosinophils Absolute: 0.1 10*3/uL (ref 0.0–0.5)
Eosinophils Relative: 2 %
HCT: 27.7 % — ABNORMAL LOW (ref 36.0–46.0)
Hemoglobin: 8.9 g/dL — ABNORMAL LOW (ref 12.0–15.0)
Immature Granulocytes: 0 %
Lymphocytes Relative: 39 %
Lymphs Abs: 3.1 10*3/uL (ref 0.7–4.0)
MCH: 28.1 pg (ref 26.0–34.0)
MCHC: 32.1 g/dL (ref 30.0–36.0)
MCV: 87.4 fL (ref 80.0–100.0)
Monocytes Absolute: 0.6 10*3/uL (ref 0.1–1.0)
Monocytes Relative: 7 %
Neutro Abs: 4.1 10*3/uL (ref 1.7–7.7)
Neutrophils Relative %: 52 %
Platelets: 282 10*3/uL (ref 150–400)
RBC: 3.17 MIL/uL — ABNORMAL LOW (ref 3.87–5.11)
RDW: 13.7 % (ref 11.5–15.5)
WBC: 8 10*3/uL (ref 4.0–10.5)
nRBC: 0 % (ref 0.0–0.2)

## 2022-08-16 LAB — GLUCOSE, CAPILLARY
Glucose-Capillary: 123 mg/dL — ABNORMAL HIGH (ref 70–99)
Glucose-Capillary: 184 mg/dL — ABNORMAL HIGH (ref 70–99)
Glucose-Capillary: 122 mg/dL — ABNORMAL HIGH (ref 70–99)
Glucose-Capillary: 105 mg/dL — ABNORMAL HIGH (ref 70–99)

## 2022-08-16 LAB — BRAIN NATRIURETIC PEPTIDE: B Natriuretic Peptide: 84 pg/mL (ref 0.0–100.0)

## 2022-08-16 LAB — BETA 2 MICROGLOBULIN, SERUM: Beta-2 Microglobulin: 2.7 mg/L — ABNORMAL HIGH (ref 0.6–2.4)

## 2022-08-16 LAB — MAGNESIUM: Magnesium: 2 mg/dL (ref 1.7–2.4)

## 2022-08-16 MED ORDER — ENOXAPARIN SODIUM 40 MG/0.4ML IJ SOSY
40.0000 mg | PREFILLED_SYRINGE | INTRAMUSCULAR | Status: DC
  Administered 2022-08-16 – 2022-08-20 (×5): 40 mg via SUBCUTANEOUS
  Filled 2022-08-16 (×4): qty 0.4

## 2022-08-16 MED ORDER — SODIUM CHLORIDE 0.9 % IV SOLN: INTRAVENOUS | Status: DC

## 2022-08-16 NOTE — Progress Notes (Signed)
Neck right-sided hemiparesis right arm weaker than the right leg, some expressive aphasia which is chronic and at baseline,                                                                   PROGRESS NOTE                                                                                                                                                                                                             Patient Demographics:    Rachael Lee, is a 57 y.o. female, DOB - July 26, 1965, VHQ:[id number]  Outpatient Primary MD for the patient is [name], MD    LOS - 3  Admit date - 08/12/2022    Chief Complaint  Patient presents with   Abdominal Pain       Brief Narrative (HPI from H&P)    o who presented with abdominal pain for about 1 day prior to admission. She has had back pain for about a week and a half since she came to [place] from [place]. She is visiting family. She has not had nausea, vomiting, diarrhea. She denies leg numbness. Over the last day she has been walking less due to pain radiating down her legs. 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 She does not have braces for her contractures. Her son relates that she has been very thirsty recently and has been drinking a lot of water. She has not been confused. In the emergency department her workup was notable for a calcium greater than 15 and acute kidney injury. PCCM was consulted for evaluation and management.   Was admitted by PCCM seen by nephrology, she was transferred to [facility] under my service on 08/14/2022.  Her workup so far is suggestive of multiple myeloma, hypercalcemia has improved, she has been seen by nephrology and oncology, plan is to commence chemotherapy at [facility] from 08/18/2022 for which she will be transferred to that facility per Dr. Bertis Ruddy.   Subjective:   Patient in bed, appears comfortable, denies any headache,  no fever, no chest pain or pressure, no shortness of breath , no abdominal pain. No focal weakness.   Assessment  & Plan :  Hypercalcemia causing abdominal pain, dehydration and renal failure with lytic spine lesions and back pain -   She has been seen by PCCM and Renal, managing hypercalcemia being hydrated, received salmon calcitonin, monitor CMP closely, suspicious for multiple myeloma, bone biopsy done by IR on 08/14/2022 follow results.  Clinically stable oncology also consulted, skeletal survey underway.  Lytic spine lesions,, compression fracture, back and leg pain. Concern for malignancy.   Bone biopsy done by IR 08/14/22, continue pain control,  SPEP and UPEP per nephrology so far looks highly suspicious for multiple myeloma, oncology consulted, per oncology start chemotherapy at [facility] on 08/18/2022, will require transfer, continue supportive care and monitor biopsy results.   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  PT OT and speech eval.  On DAPT, back on DAPT at home dose along with Zetia and statin.  Dyslipidemia.  Continue statin and Zetia combination.  Hypertension.  Home dose Norvasc and Lopressor.      Condition - Extremely Guarded  Family Communication  :  son [name] [phone]  on 08/14/22  Code Status :  Full  Consults  :  PCCM, Renal, IR, Onc  PUD Prophylaxis :    Procedures  :     CT Biopsy 08/14/22 - 1. Successful CT-guided focal bone core needle biopsy of lytic lesion left posterior ilium. 2. Successful CT-guided bone marrow aspiration and core biopsy of the right posterior ilium  CT Head - 1. No acute intracranial abnormality. 2. Large chronic left MCA distribution infarct, with additional small remote right cerebellar infarct. 3. 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  CT - 1. No acute intra-abdominal  pathology identified. 2. Moderate coronary artery calcification. 3. Moderate asymmetric right renal cortical atrophy. 4. Mild bilateral nonobstructing nephrolithiasis. No urolithiasis. No hydronephrosis. 5. 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 6. Scattered lytic lesions throughout the lumbar spine and pelvis suspicious for osseous metastatic disease or myeloma. Minimally displaced pathologic fracture of the inferior right pubic ramus. 7. Age indeterminate superior endplate fracture of T9 with no significant loss of height. Aortic Atherosclerosis       Disposition Plan  :    Status is: Inpatient   DVT Prophylaxis  :    enoxaparin (LOVENOX) injection 40 mg Start: 08/16/22 1400 SCDs Start: 08/13/22 0057    Lab Results  Component Value Date   PLT 282 08/16/2022    Diet :  Diet Order             Diet heart healthy/carb modified Room service appropriate? Yes; Fluid consistency: Thin  Diet effective now                    Inpatient Medications  Scheduled Meds:  amLODipine  5 mg Oral Daily   aspirin EC  81 mg Oral Daily   atorvastatin  80 mg Oral QHS   Chlorhexidine Gluconate Cloth  6 each Topical Daily   docusate sodium  200 mg Oral BID   enoxaparin (LOVENOX) injection  40 mg Subcutaneous Q24H   ezetimibe  10 mg Oral Daily   ferrous sulfate  325 mg Oral Daily   fluticasone furoate-vilanterol  1 puff Inhalation Daily   insulin aspart  0-6 Units Subcutaneous TID WC   metoprolol tartrate  25 mg Oral BID   polyethylene glycol  17 g Oral BID   senna-docusate  1 tablet Oral  BID   ticagrelor  90 mg Oral BID   Continuous Infusions:  rasburicase (ELITEK) 6 mg in sodium chloride 0.9 % 46 mL IVPB     PRN Meds:.acetaminophen, hydrALAZINE, melatonin, ondansetron (ZOFRAN) IV, mouth rinse, oxyCODONE  Antibiotics  :    Anti-infectives (From admission, onward)    None         Objective:   Vitals:   08/15/22 2348 08/16/22 0300 08/16/22 0523 08/16/22 0740  BP: (!)  109/57  (!) 114/52 92/66  Pulse:    70  Resp:    14  Temp: 98.8 F (37.1 C)  98.6 F (37 C) 98.1 F (36.7 C)  TempSrc: Oral  Oral Oral  SpO2:    98%  Weight:  86.1 kg    Height:        Wt Readings from Last 3 Encounters:  08/16/22 86.1 kg     Intake/Output Summary (Last 24 hours) at 08/16/2022 0946 Last data filed at 08/16/2022 0705 Gross per 24 hour  Intake --  Output 2300 ml  Net -2300 ml     Physical Exam  Awake Alert, Chr R arm >> R leg weakness, expressive aphasia [place].AT,PERRAL Supple Neck, No JVD,   Symmetrical Chest wall movement, Good air movement bilaterally, CTAB RRR,No Gallops,Rubs or new Murmurs,  +ve B.Sounds, Abd Soft, No tenderness,   No Cyanosis, Clubbing or edema        Data Review:    Recent Labs  Lab 08/12/22 2021 08/13/22 0248 08/14/22 0630 08/15/22 0536 08/16/22 0315  WBC 10.8* 10.0 9.2 7.7 8.0  HGB 10.5* 10.3* 9.4* 9.0* 8.9*  HCT 33.7* 32.1* 27.7* 27.3* 27.7*  PLT 347 361 313 279 282  MCV 91.1 87.7 85.2 85.8 87.4  MCH 28.4 28.1 28.9 28.3 28.1  MCHC 31.2 32.1 33.9 33.0 32.1  RDW 13.9 13.7 13.6 13.7 13.7  LYMPHSABS 4.9*  --  1.3 2.7 3.1  MONOABS 0.6  --  0.6 0.6 0.6  EOSABS 0.5  --  0.1 0.0 0.1  BASOSABS 0.0  --  0.0 0.0 0.0    Recent Labs  Lab 08/12/22 2021 08/13/22 0248 08/13/22 1308 08/13/22 1817 08/14/22 0629 08/14/22 1116 08/15/22 0536 08/16/22 0315  NA 136 138 138 138  --  139 137 138  K 4.0 4.1 4.3 4.5  --  4.0 3.7 3.9  CL 101 107 103 105  --  108 110 109  CO2 22 22 19* 21*  --  22 23 22   ANIONGAP 13 9 16* 12  --  9 4* 7  GLUCOSE 103* 110* 190* 139*  --  112* 88 91  BUN 43* 37* 39* 37*  --  32* 27* 26*  CREATININE 2.48* 2.30* 2.27* 2.16*  --  1.98* 1.73* 1.89*  AST 15  --   --   --   --  17 14* 13*  ALT 15  --   --   --   --  13 12 16   ALKPHOS 62  --   --   --   --  49 46 47  BILITOT 0.7  --   --   --   --  0.6 0.6 0.8  ALBUMIN 3.6  --  3.3*  --   --  3.1* 2.8* 2.7*  INR  --   --   --   --  1.2  --   --   --    HGBA1C  --   --  7.4*  --   --   --   --   --  BNP  --   --   --   --   --   --  246.3* 84.0  MG 1.9 1.8  --   --   --   --  2.1 2.0  CALCIUM >15.0* 13.1* 12.3* 12.0*  --  10.4* 9.7 10.0      Recent Labs  Lab 08/12/22 2021 08/13/22 0248 08/13/22 1308 08/13/22 1817 08/14/22 0629 08/14/22 1116 08/15/22 0536 08/16/22 0315  INR  --   --   --   --  1.2  --   --   --   HGBA1C  --   --  7.4*  --   --   --   --   --   BNP  --   --   --   --   --   --  246.3* 84.0  MG 1.9 1.8  --   --   --   --  2.1 2.0  CALCIUM >15.0* 13.1* 12.3* 12.0*  --  10.4* 9.7 10.0     Lab Results  Component Value Date   HGBA1C 7.4 (H) 08/13/2022      Radiology Reports DG Bone Survey Met  Result Date: 08/15/2022 CLINICAL DATA:  Multiple myeloma. EXAM: METASTATIC BONE SURVEY COMPARISON:  CT scan of August 12, 2022. Radiographs of June 20, 2011. FINDINGS: Several small lucencies are noted in the skull. Several small lucencies are noted in the distal portion of the right radius and ulna. Multilevel degenerative changes are noted in lower cervical spine. Lucency is seen involving the medial portion of left acetabulum which corresponds to the lesion seen on CT scan. Multiple other smaller lucencies are seen throughout the visualized pelvis. IMPRESSION: Multiple lucencies are noted throughout the visualized pelvis, with the largest noted in the medial portion of the left acetabulum as seen on prior CT scan. Also noted are several small lucencies in the distal portion of the right radius and ulna. Several small lucencies are also noted in the skull. These are consistent with history of multiple myeloma. Electronically Signed   By: Lupita Raider M.D.   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 CT-guided bone lesion biopsy of the left ilium 2. CT-guided bone marrow aspiration and core biopsy  MEDICATIONS: None. ANESTHESIA/SEDATION: Moderate (conscious) sedation was employed during this procedure. A total of Versed 1 mg and Fentanyl 50 mcg was administered intravenously. Moderate Sedation Time: 19 minutes. The patient's level of consciousness and vital signs were monitored continuously by radiology nursing throughout the procedure under my direct supervision. FLUOROSCOPY TIME:  N/a COMPLICATIONS: None immediate. PROCEDURE: Informed written consent was obtained from the patient after a thorough discussion of the procedural risks, benefits and alternatives. All questions were addressed. Maximal Sterile Barrier Technique was utilized including caps, mask, sterile gowns, sterile gloves, sterile drape, hand hygiene and skin antiseptic. A timeout was performed prior to the initiation of the procedure. The patient was placed prone on the exam table. Limited CT of the pelvis was performed for planning purposes. This demonstrated lytic lesion in the posterior left ilium. Skin entry site was marked, and the overlying skin was prepped and draped in the standard sterile fashion. Local analgesia was obtained with 1% lidocaine. Using intermittent CT fluoroscopy, a 11 gauge bone biopsy needle was advanced towards the identified lesion and biopsy performed. Specimens were submitted in formalin to pathology for further handling. Attention  was then turned to the contralateral side for bone marrow aspiration and core biopsy. Limited CT of the pelvis was performed for planning purposes. Skin entry site was marked, and the overlying skin was prepped and draped in the standard sterile fashion. Local analgesia was obtained with 1% lidocaine. Using CT guidance, an 11 gauge needle was advanced just deep to the cortex of the right posterior ilium. Subsequently, bone marrow aspiration and core biopsy were performed. Specimens were submitted to lab/pathology for handling. Hemostasis was achieved with manual pressure, and a clean  dressing was placed. The patient tolerated the procedure well without immediate complication. IMPRESSION: 1. Successful CT-guided focal bone core needle biopsy of lytic lesion left posterior ilium. 2. Successful CT-guided bone marrow aspiration and core biopsy of the right posterior ilium. Electronically Signed   By: Olive Bass M.D.   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 CT-guided bone lesion biopsy of the left ilium 2. CT-guided bone marrow aspiration and core biopsy MEDICATIONS: None. ANESTHESIA/SEDATION: Moderate (conscious) sedation was employed during this procedure. A total of Versed 1 mg and Fentanyl 50 mcg was administered intravenously. Moderate Sedation Time: 19 minutes. The patient's level of consciousness and vital signs were monitored continuously by radiology nursing throughout the procedure under my direct supervision. FLUOROSCOPY TIME:  N/a COMPLICATIONS: None immediate. PROCEDURE: Informed written consent was obtained from the patient after a thorough discussion of the procedural risks, benefits and alternatives. All questions were addressed. Maximal Sterile Barrier Technique was utilized including caps, mask, sterile gowns, sterile gloves, sterile drape, hand hygiene and skin antiseptic. A timeout was performed prior to the initiation of the procedure. The patient was placed prone on the exam table. Limited CT of the pelvis was performed for planning purposes. This demonstrated lytic lesion in the posterior left ilium. Skin entry site was marked, and the overlying skin was prepped and draped in the standard sterile fashion. Local analgesia was obtained with 1% lidocaine. Using intermittent CT fluoroscopy, a 11 gauge bone biopsy needle was advanced towards the identified lesion and biopsy performed. Specimens were submitted in formalin to pathology for  further handling. Attention was then turned to the contralateral side for bone marrow aspiration and core biopsy. Limited CT of the pelvis was performed for planning purposes. Skin entry site was marked, and the overlying skin was prepped and draped in the standard sterile fashion. Local analgesia was obtained with 1% lidocaine. Using CT guidance, an 11 gauge needle was advanced just deep to the cortex of the right posterior ilium. Subsequently, bone marrow aspiration and core biopsy were performed. Specimens were submitted to lab/pathology for handling. Hemostasis was achieved with manual pressure, and a clean dressing was placed. The patient tolerated the procedure well without immediate complication. IMPRESSION: 1. Successful CT-guided focal bone core needle biopsy of lytic lesion left posterior ilium. 2. Successful CT-guided bone marrow aspiration and core biopsy of the right posterior ilium. Electronically Signed   By: Olive Bass M.D.   On: 08/14/2022 13:31   DG Abd Portable 1V  Result Date: 08/14/2022 CLINICAL DATA:  Constipation EXAM: PORTABLE ABDOMEN - 1 VIEW COMPARISON:  None Available. FINDINGS: Scattered stool throughout colon, grossly normal stool burden. Small amount gas and stool in rectum. No bowel dilatation or bowel wall thickening. Bones demineralized. IMPRESSION: Nonobstructive bowel gas pattern. Electronically Signed   By: Ulyses Southward M.D.   On: 08/14/2022  12:27   CT Head Wo Contrast  Result Date: 08/12/2022 CLINICAL DATA:  Initial evaluation for mental status change. EXAM: CT HEAD WITHOUT CONTRAST TECHNIQUE: Contiguous axial images were obtained from the base of the skull through the vertex without intravenous contrast. RADIATION DOSE REDUCTION: This exam was performed according to the departmental dose-optimization program which includes automated exposure control, adjustment of the mA and/or kV according to patient size and/or use of iterative reconstruction technique. COMPARISON:   None Available. FINDINGS: Brain: Large area of encephalomalacia involving the left cerebral hemisphere, consistent with a chronic left MCA distribution infarct. Probable superimposed areas of scattered laminar necrosis within this area. Additional small remote right cerebellar infarct. No acute intracranial hemorrhage. No acute large vessel territory infarct. No mass lesion or midline shift. No hydrocephalus or extra-axial fluid collection. Vascular: No abnormal hyperdense vessel. Scattered calcified atherosclerosis present at the skull base. Skull: Scalp soft tissues and calvarium demonstrate no acute finding. Sinuses/Orbits: Globes and orbital soft tissues within normal limits. 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 Additional chronic mucoperiosteal thickening present about the sphenoethmoidal and maxillary sinuses. No mastoid effusion. Other: None. IMPRESSION: 1. No acute intracranial abnormality. 2. Large chronic left MCA distribution infarct, with additional small remote right cerebellar infarct. 3. 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 Electronically Signed   By: Rise Mu M.D.   On: 08/12/2022 23:10   CT ABDOMEN PELVIS WO CONTRAST  Result Date: 08/12/2022 CLINICAL DATA:  Abdominal pain, acute, nonlocalized EXAM: CT ABDOMEN AND PELVIS WITHOUT CONTRAST TECHNIQUE: Multidetector CT imaging of the abdomen and pelvis was performed following the standard protocol without IV contrast. RADIATION DOSE REDUCTION: This exam was performed according to the departmental dose-optimization program which includes automated exposure control, adjustment of the mA and/or kV according to patient size and/or use of iterative reconstruction technique. COMPARISON:  None Available. FINDINGS: Lower chest: Moderate coronary artery calcification. Right coronary artery stenting has been performed. Cardiac size within normal  limits. No pericardial effusion. Visualized lung bases are clear. Hepatobiliary: No focal liver abnormality is seen. No gallstones, gallbladder wall thickening, or biliary dilatation. Pancreas: Unremarkable Spleen: Unremarkable Adrenals/Urinary Tract: The adrenal glands are unremarkable. The kidneys are normal in position. Moderate asymmetric right renal cortical atrophy. Multiple nonobstructing calculi are seen within the kidneys bilaterally measuring up to 5 mm within the lower pole the left kidney. No hydronephrosis. No ureteral calculi. Multiple simple cortical cysts are seen within the kidneys bilaterally for which no follow-up imaging is recommended. The bladder is unremarkable. Stomach/Bowel: Stomach is within normal limits. Appendix appears normal. No evidence of bowel wall thickening, distention, or inflammatory changes. Vascular/Lymphatic: Aortic atherosclerosis. No enlarged abdominal or pelvic lymph nodes. Reproductive: Dermoid cyst within the right ovary identified demonstrating macroscopic fat measuring 7.2 x 9.4 x 7.7 cm. Uterus and left adnexa are unremarkable. Other: No abdominal wall hernia. Musculoskeletal: Degenerative changes are seen within the lumbar spine. There are superimposed scattered lytic lesions noted throughout the lumbar spine and pelvis, the largest of which are is seen within the left iliac spine and axiall image # 56/3 measuring 15 mm, suspicious for osseous metastatic disease or myeloma. Pathologic fracture noted involving the lytic lesion bobbing the inferior right pubic ramus. Age indeterminate superior endplate fracture T9 with no significant loss of height. IMPRESSION: 1. No acute intra-abdominal pathology identified. 2. Moderate coronary artery calcification. 3. Moderate asymmetric right renal cortical atrophy. 4. Mild bilateral nonobstructing nephrolithiasis. No urolithiasis.  No hydronephrosis. 5. 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 6. Scattered lytic lesions  throughout the lumbar spine and pelvis suspicious for osseous metastatic disease or myeloma. Minimally displaced pathologic fracture of the inferior right pubic ramus. 7. Age indeterminate superior endplate fracture of T9 with no significant loss of height. Aortic Atherosclerosis (ICD10-I70.0). Electronically Signed   By: Helyn Numbers M.D.   On: 08/12/2022 23:06      Signature  -   Susa Raring M.D on 08/16/2022 at 9:46 AM   -  To page go to [email or web address]

## 2022-08-16 NOTE — Progress Notes (Signed)
[facility] Progress Note  Name: Rachael Lee MRN: [id number] DOB: Apr 30, 1965  Chief Complaint:  Abdominal pain and back pain   Subjective:  She had 1.7 liters UOP over 4/26.  She had a bone lesion biopsy as well as a bone marrow biopsy on 4/25 - path is pending.  Hem/onc was consulted and they plan to start chemo next week as an inpatient.  I spoke with the patient and her son who is also here; her son states that they have been told of the diagnosis of multiple myeloma.    Review of systems:   She states that she is a little short of breath.  She denies chest pain  Denies n/v.   She's ready for the foley to come out! Doesn't have one at home  ------------- Background on referral:  Rachael Lee is a 57 y.o. female with a history of HTN, DM, prior CVA, and asthma who presented to the hospital with abdominal pain as well as back pain for the past week and a half.  She is here visiting family and lives out of state in [place].  I spoke with her son on the phone in the room via speakerphone.  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  She is still having the abdominal pain.  She denies any NSAID use.  She does take aspirin daily OTC.  She was found to have hypercalcemia as well as acute kidney injury.  Her presenting calcium was greater than 15. high. She has been given calcitonin x 2 doses and has been on normal saline.  Her creatinine is improving with hydration.  Unfortunately, other work-up has revealed scattered lytic lesions throughout the lumbar spine and pelvis suspicious for metastatic disease or myeloma.  Note that she had a stroke 2 years ago and has expressive aphasia secondary to the same as well as right-sided weakness.  Nephrology is consulted for assistance with management of hypercalcemia and AKI.  She had 1.5 liters UOP over 4/23 and has had 820 mL uop thus far today.   CT a/p without hydro; mild bilateral non-obstructing  nephrolithiasis.  Nursing states bone marrow biopsy is scheduled for tomorrow.      Intake/Output Summary (Last 24 hours) at 08/16/2022 1456 Last data filed at 08/16/2022 1235 Gross per 24 hour  Intake 50 ml  Output 2300 ml  Net -2250 ml    Vitals:  Vitals:   08/16/22 0523 08/16/22 0740 08/16/22 1141 08/16/22 1155  BP: (!) 114/52 92/66 (!) 97/50 113/66  Pulse:  70 66 62  Resp:  14 18 18   Temp: 98.6 F (37 C) 98.1 F (36.7 C) 98.3 F (36.8 C) 98.4 F (36.9 C)  TempSrc: Oral Oral Oral Oral  SpO2:  98% 98% 98%  Weight:      Height:         Physical Exam:   General:  adult female in bed in NAD  HEENT: NCAT Eyes: EOMI  Neck: sclera anicteric Heart: supple trachea midline  Lungs: clear to auscultation; normal work of breathing on room air  Abdomen: soft/nt/obese habitus/distended  Extremities: no edema lower extremities  Skin: no rash on extremities exposed  Psych no anxiety or agitation  Neuro: patient is awake and interactive; expressive aphasia but motions with her hands and has limited speech; on gross exam patient with right sided weakness GU foley catheter in place with urine   Medications reviewed   Labs:     Latest  Ref Rng & Units 08/16/2022    3:15 AM 08/15/2022    5:36 AM 08/14/2022   11:16 AM  BMP  Glucose 70 - 99 mg/dL 91  88  604   BUN 6 - 20 mg/dL 26  27  32   Creatinine 0.44 - 1.00 mg/dL 5.40  9.81  1.91   Sodium 135 - 145 mmol/L 138  137  139   Potassium 3.5 - 5.1 mmol/L 3.9  3.7  4.0   Chloride 98 - 111 mmol/L 109  110  108   CO2 22 - 32 mmol/L 22  23  22    Calcium 8.9 - 10.3 mg/dL 47.8  9.7  29.5      Assessment/Plan:   # Hypercalcemia  - Constellation of symptoms worrisome for malignancy. PTH appropriately low.  Extremely low kappa/lamba free light chains ratio suggestive of plasma cell dyscrasia.  Note that PTHrp was sent as well.  - Continue normal saline  - Deferred bisphosphonate given renal function  - SPEP was normal but UPEP with M  spike - Bence Jones Protein positive, lambda type and kappa type noted - Appreciate hem/onc  - Resume fluids - NS at 75 ml/hr x 24 hours and reassess    # AKI  - prerenal and improving with hydration; setting of marked hypercalcemia and concern for plasma cell dyscrasia - note baseline Cr 1.1 per 01/2021 Care Everywhere records  - Resolving with hydration  - Discontinue foley     # Lytic spine lesions  - Bone marrow biopsy was performed with path pending   # HTN  - Noted that she has had some hypotension - Stop amlodipine 5 mg daily for now (new med this admission)   # Hx Stroke - residual expressive aphasia and right-sided weakness - HTN control as above  - she is on aspirin and ticagrelor per primary team   # Normocytic Anemia  - in setting of malignancy   # Hyperuricemia  - Watch for TLS signs - Continue hydration as tolerated - Would consider allopurinol per primary team discretion.  See that hem/onc is planning for rasburicase  Disposition - would continue inpatient monitoring   Estanislado Emms, MD 08/16/2022 3:15 PM

## 2022-08-16 NOTE — Plan of Care (Signed)

## 2022-08-17 DIAGNOSIS — N179 Acute kidney failure, unspecified: Secondary | ICD-10-CM | POA: Diagnosis not present

## 2022-08-17 LAB — CBC WITH DIFFERENTIAL/PLATELET
Abs Immature Granulocytes: 0.03 10*3/uL (ref 0.00–0.07)
Basophils Absolute: 0 10*3/uL (ref 0.0–0.1)
Basophils Relative: 0 %
Eosinophils Absolute: 0.4 10*3/uL (ref 0.0–0.5)
Eosinophils Relative: 4 %
HCT: 26.6 % — ABNORMAL LOW (ref 36.0–46.0)
Hemoglobin: 8.9 g/dL — ABNORMAL LOW (ref 12.0–15.0)
Immature Granulocytes: 0 %
Lymphocytes Relative: 43 %
Lymphs Abs: 4.1 10*3/uL — ABNORMAL HIGH (ref 0.7–4.0)
MCH: 28.5 pg (ref 26.0–34.0)
MCHC: 33.5 g/dL (ref 30.0–36.0)
MCV: 85.3 fL (ref 80.0–100.0)
Monocytes Absolute: 0.5 10*3/uL (ref 0.1–1.0)
Monocytes Relative: 6 %
Neutro Abs: 4.4 10*3/uL (ref 1.7–7.7)
Neutrophils Relative %: 47 %
Platelets: 303 10*3/uL (ref 150–400)
RBC: 3.12 MIL/uL — ABNORMAL LOW (ref 3.87–5.11)
RDW: 13.6 % (ref 11.5–15.5)
WBC: 9.5 10*3/uL (ref 4.0–10.5)
nRBC: 0 % (ref 0.0–0.2)

## 2022-08-17 LAB — COMPREHENSIVE METABOLIC PANEL
ALT: 18 U/L (ref 0–44)
AST: 13 U/L — ABNORMAL LOW (ref 15–41)
Albumin: 2.9 g/dL — ABNORMAL LOW (ref 3.5–5.0)
Alkaline Phosphatase: 49 U/L (ref 38–126)
Anion gap: 6 (ref 5–15)
BUN: 30 mg/dL — ABNORMAL HIGH (ref 6–20)
CO2: 22 mmol/L (ref 22–32)
Calcium: 9.8 mg/dL (ref 8.9–10.3)
Chloride: 109 mmol/L (ref 98–111)
Creatinine, Ser: 2.11 mg/dL — ABNORMAL HIGH (ref 0.44–1.00)
GFR, Estimated: 27 mL/min — ABNORMAL LOW (ref >60)
Glucose, Bld: 101 mg/dL — ABNORMAL HIGH (ref 70–99)
Potassium: 4.1 mmol/L (ref 3.5–5.1)
Sodium: 137 mmol/L (ref 135–145)
Total Bilirubin: 0.6 mg/dL (ref 0.3–1.2)
Total Protein: 5.7 g/dL — ABNORMAL LOW (ref 6.5–8.1)

## 2022-08-17 LAB — GLUCOSE, CAPILLARY
Glucose-Capillary: 110 mg/dL — ABNORMAL HIGH (ref 70–99)
Glucose-Capillary: 131 mg/dL — ABNORMAL HIGH (ref 70–99)
Glucose-Capillary: 110 mg/dL — ABNORMAL HIGH (ref 70–99)
Glucose-Capillary: 110 mg/dL — ABNORMAL HIGH (ref 70–99)

## 2022-08-17 LAB — BRAIN NATRIURETIC PEPTIDE: B Natriuretic Peptide: 60.6 pg/mL (ref 0.0–100.0)

## 2022-08-17 LAB — MAGNESIUM: Magnesium: 1.8 mg/dL (ref 1.7–2.4)

## 2022-08-17 NOTE — Progress Notes (Signed)
Neck right-sided hemiparesis right arm weaker than the right leg, some expressive aphasia which is chronic and at baseline,                                                                   PROGRESS NOTE                                                                                                                                                                                                             Patient Demographics:    Rachael Lee, is a 57 y.o. female, DOB - 01-10-1966, ZOX:[id number]  Outpatient Primary MD for the patient is [name], MD    LOS - 4  Admit date - 08/12/2022    Chief Complaint  Patient presents with   Abdominal Pain       Brief Narrative (HPI from H&P)    o who presented with abdominal pain for about 1 day prior to admission. She has had back pain for about a week and a half since she came to [place] from [place]. She is visiting family. She has not had nausea, vomiting, diarrhea. She denies leg numbness. Over the last day she has been walking less due to pain radiating down her legs. 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 She does not have braces for her contractures. Her son relates that she has been very thirsty recently and has been drinking a lot of water. She has not been confused. In the emergency department her workup was notable for a calcium greater than 15 and acute kidney injury. PCCM was consulted for evaluation and management.   Was admitted by PCCM seen by nephrology, she was transferred to [facility] under my service on 08/14/2022.  Her workup so far is suggestive of multiple myeloma, hypercalcemia has improved, she has been seen by nephrology and oncology, plan is to commence chemotherapy at [facility] from 08/18/2022 for which she will be transferred to that facility per Dr. Bertis Ruddy.   Subjective:   Patient in bed, appears comfortable, denies any headache,  no fever, no chest pain or pressure, no shortness of breath , no abdominal pain. No new focal weakness.   Assessment  & Plan :  Hypercalcemia causing abdominal pain, dehydration and renal failure with lytic spine lesions and back pain -   She has been seen by PCCM and Renal, managing hypercalcemia being hydrated, received salmon calcitonin, monitor CMP closely, suspicious for multiple myeloma, bone biopsy done by IR on 08/14/2022 follow results, seen by nephrology and oncology.  Lytic spine lesions,, compression fracture, back and leg pain. Concern for malignancy.   Bone biopsy done by IR 08/14/22, continue pain control,  SPEP and UPEP per nephrology so far looks highly suspicious for multiple myeloma, skeletal survey also appears positive, oncology consulted, per oncology start chemotherapy at [facility] long hospital on 08/18/2022, will require transfer, continue supportive care and monitor biopsy results.  Plan discussed with son [name] on 08/17/2022.   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  PT OT and speech eval.  On DAPT, back on DAPT at home dose along with Zetia and statin.  Dyslipidemia.  Continue statin and Zetia combination.  Hypertension.  Home dose Norvasc and Lopressor.      Condition - Extremely Guarded  Family Communication  :    Dorna Bloom [phone]  on 08/14/22 Lovena Neighbours [phone]  on 08/17/22  Code Status :  Full  Consults  :  PCCM, Renal, IR, Onc  PUD Prophylaxis :    Procedures  :     CT Biopsy 08/14/22 - 1. Successful CT-guided focal bone core needle biopsy of lytic lesion left posterior ilium. 2. Successful CT-guided bone marrow aspiration and core biopsy of the right posterior ilium  CT Head - 1. No acute intracranial abnormality. 2. Large chronic left MCA distribution infarct, with additional small remote right cerebellar infarct. 3. 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  CT - 1. No acute intra-abdominal pathology identified. 2. Moderate coronary artery calcification. 3. Moderate asymmetric right renal cortical atrophy. 4. Mild bilateral nonobstructing nephrolithiasis. No urolithiasis. No hydronephrosis. 5. 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 6. Scattered lytic lesions throughout the lumbar spine and pelvis suspicious for osseous metastatic disease or myeloma. Minimally displaced pathologic fracture of the inferior right pubic ramus. 7. Age indeterminate superior endplate fracture of T9 with no significant loss of height. Aortic Atherosclerosis       Disposition Plan  :    Status is: Inpatient   DVT Prophylaxis  :    enoxaparin (LOVENOX) injection 40 mg Start: 08/16/22 1400 SCDs Start: 08/13/22 0057    Lab Results  Component Value Date   PLT 303 08/17/2022    Diet :  Diet Order             Diet heart healthy/carb modified Room service appropriate? Yes; Fluid consistency: Thin  Diet effective now                    Inpatient Medications  Scheduled Meds:  aspirin EC  81 mg Oral Daily   atorvastatin  80 mg Oral QHS   Chlorhexidine Gluconate Cloth  6 each Topical Daily   docusate sodium  200 mg Oral BID   enoxaparin (LOVENOX) injection  40 mg Subcutaneous Q24H   ezetimibe  10 mg Oral Daily   ferrous sulfate  325 mg Oral Daily   fluticasone furoate-vilanterol  1 puff Inhalation Daily   insulin aspart  0-6 Units Subcutaneous TID WC   metoprolol tartrate  25 mg Oral BID   polyethylene glycol  17 g  Oral BID   senna-docusate  1 tablet Oral BID   ticagrelor  90 mg Oral BID   Continuous Infusions:  sodium chloride 75 mL/hr at 08/17/22 0443   PRN Meds:.acetaminophen, hydrALAZINE, melatonin, ondansetron (ZOFRAN) IV, mouth rinse, oxyCODONE  Antibiotics  :    Anti-infectives (From admission, onward)    None         Objective:   Vitals:   08/16/22 2017 08/16/22 2152  08/16/22 2343 08/17/22 0553  BP: (!) 142/70 (!) 148/82    Pulse: 68 85 81   Resp: 20     Temp: 98 F (36.7 C)  98.2 F (36.8 C) 97.7 F (36.5 C)  TempSrc: Oral  Oral Oral  SpO2:      Weight:      Height:        Wt Readings from Last 3 Encounters:  08/16/22 86.1 kg     Intake/Output Summary (Last 24 hours) at 08/17/2022 0750 Last data filed at 08/16/2022 1743 Gross per 24 hour  Intake 149.43 ml  Output 800 ml  Net -650.57 ml     Physical Exam  Awake Alert, Chr R arm >> R leg weakness, expressive aphasia [place].AT,PERRAL Supple Neck, No JVD,   Symmetrical Chest wall movement, Good air movement bilaterally, CTAB RRR,No Gallops,Rubs or new Murmurs,  +ve B.Sounds, Abd Soft, No tenderness,   No Cyanosis, Clubbing or edema        Data Review:    Recent Labs  Lab 08/12/22 2021 08/13/22 0248 08/14/22 0630 08/15/22 0536 08/16/22 0315 08/17/22 0219  WBC 10.8* 10.0 9.2 7.7 8.0 9.5  HGB 10.5* 10.3* 9.4* 9.0* 8.9* 8.9*  HCT 33.7* 32.1* 27.7* 27.3* 27.7* 26.6*  PLT 347 361 313 279 282 303  MCV 91.1 87.7 85.2 85.8 87.4 85.3  MCH 28.4 28.1 28.9 28.3 28.1 28.5  MCHC 31.2 32.1 33.9 33.0 32.1 33.5  RDW 13.9 13.7 13.6 13.7 13.7 13.6  LYMPHSABS 4.9*  --  1.3 2.7 3.1 4.1*  MONOABS 0.6  --  0.6 0.6 0.6 0.5  EOSABS 0.5  --  0.1 0.0 0.1 0.4  BASOSABS 0.0  --  0.0 0.0 0.0 0.0    Recent Labs  Lab 08/12/22 2021 08/13/22 0248 08/13/22 1308 08/13/22 1817 08/14/22 0629 08/14/22 1116 08/15/22 0536 08/16/22 0315 08/17/22 0219  NA 136 138 138 138  --  139 137 138 137  K 4.0 4.1 4.3 4.5  --  4.0 3.7 3.9 4.1  CL 101 107 103 105  --  108 110 109 109  CO2 22 22 19* 21*  --  22 23 22 22   ANIONGAP 13 9 16* 12  --  9 4* 7 6  GLUCOSE 103* 110* 190* 139*  --  112* 88 91 101*  BUN 43* 37* 39* 37*  --  32* 27* 26* 30*  CREATININE 2.48* 2.30* 2.27* 2.16*  --  1.98* 1.73* 1.89* 2.11*  AST 15  --   --   --   --  17 14* 13* 13*  ALT 15  --   --   --   --  13 12 16 18   ALKPHOS 62  --   --    --   --  49 46 47 49  BILITOT 0.7  --   --   --   --  0.6 0.6 0.8 0.6  ALBUMIN 3.6  --  3.3*  --   --  3.1* 2.8* 2.7* 2.9*  INR  --   --   --   --  1.2  --   --   --   --   HGBA1C  --   --  7.4*  --   --   --   --   --   --   BNP  --   --   --   --   --   --  246.3* 84.0 60.6  MG 1.9 1.8  --   --   --   --  2.1 2.0 1.8  CALCIUM >15.0* 13.1* 12.3* 12.0*  --  10.4* 9.7 10.0 9.8     Recent Labs  Lab 08/12/22 2021 08/13/22 0248 08/13/22 1308 08/13/22 1817 08/14/22 0629 08/14/22 1116 08/15/22 0536 08/16/22 0315 08/17/22 0219  INR  --   --   --   --  1.2  --   --   --   --   HGBA1C  --   --  7.4*  --   --   --   --   --   --   BNP  --   --   --   --   --   --  246.3* 84.0 60.6  MG 1.9 1.8  --   --   --   --  2.1 2.0 1.8  CALCIUM >15.0* 13.1* 12.3* 12.0*  --  10.4* 9.7 10.0 9.8     Lab Results  Component Value Date   HGBA1C 7.4 (H) 08/13/2022      Radiology Reports DG Bone Survey Met  Result Date: 08/15/2022 CLINICAL DATA:  Multiple myeloma. EXAM: METASTATIC BONE SURVEY COMPARISON:  CT scan of August 12, 2022. Radiographs of June 20, 2011. FINDINGS: Several small lucencies are noted in the skull. Several small lucencies are noted in the distal portion of the right radius and ulna. Multilevel degenerative changes are noted in lower cervical spine. Lucency is seen involving the medial portion of left acetabulum which corresponds to the lesion seen on CT scan. Multiple other smaller lucencies are seen throughout the visualized pelvis. IMPRESSION: Multiple lucencies are noted throughout the visualized pelvis, with the largest noted in the medial portion of the left acetabulum as seen on prior CT scan. Also noted are several small lucencies in the distal portion of the right radius and ulna. Several small lucencies are also noted in the skull. These are consistent with history of multiple myeloma. Electronically Signed   By: Lupita Raider M.D.   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 CT-guided bone lesion biopsy of the left ilium 2. CT-guided bone marrow aspiration and core biopsy MEDICATIONS: None. ANESTHESIA/SEDATION: Moderate (conscious) sedation was employed during this procedure. A total of Versed 1 mg and Fentanyl 50 mcg was administered intravenously. Moderate Sedation Time: 19 minutes. The patient's level of consciousness and vital signs were monitored continuously by radiology nursing throughout the procedure under my direct supervision. FLUOROSCOPY TIME:  N/a COMPLICATIONS: None immediate. PROCEDURE: Informed written consent was obtained from the patient after a thorough discussion of the procedural risks, benefits and alternatives. All questions were addressed. Maximal Sterile Barrier Technique was utilized including caps, mask, sterile gowns, sterile gloves, sterile drape, hand hygiene and skin antiseptic. A timeout was performed prior to the initiation of the procedure. The patient was placed prone on the exam table. Limited CT of the pelvis was performed for planning purposes. This demonstrated lytic lesion in the posterior left ilium. Skin  entry site was marked, and the overlying skin was prepped and draped in the standard sterile fashion. Local analgesia was obtained with 1% lidocaine. Using intermittent CT fluoroscopy, a 11 gauge bone biopsy needle was advanced towards the identified lesion and biopsy performed. Specimens were submitted in formalin to pathology for further handling. Attention was then turned to the contralateral side for bone marrow aspiration and core biopsy. Limited CT of the pelvis was performed for planning purposes. Skin entry site was marked, and the overlying skin was prepped and draped in the standard sterile fashion. Local analgesia was obtained with 1% lidocaine. Using CT guidance, an 11 gauge needle was advanced just  deep to the cortex of the right posterior ilium. Subsequently, bone marrow aspiration and core biopsy were performed. Specimens were submitted to lab/pathology for handling. Hemostasis was achieved with manual pressure, and a clean dressing was placed. The patient tolerated the procedure well without immediate complication. IMPRESSION: 1. Successful CT-guided focal bone core needle biopsy of lytic lesion left posterior ilium. 2. Successful CT-guided bone marrow aspiration and core biopsy of the right posterior ilium. Electronically Signed   By: Olive Bass M.D.   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 CT-guided bone lesion biopsy of the left ilium 2. CT-guided bone marrow aspiration and core biopsy MEDICATIONS: None. ANESTHESIA/SEDATION: Moderate (conscious) sedation was employed during this procedure. A total of Versed 1 mg and Fentanyl 50 mcg was administered intravenously. Moderate Sedation Time: 19 minutes. The patient's level of consciousness and vital signs were monitored continuously by radiology nursing throughout the procedure under my direct supervision. FLUOROSCOPY TIME:  N/a COMPLICATIONS: None immediate. PROCEDURE: Informed written consent was obtained from the patient after a thorough discussion of the procedural risks, benefits and alternatives. All questions were addressed. Maximal Sterile Barrier Technique was utilized including caps, mask, sterile gowns, sterile gloves, sterile drape, hand hygiene and skin antiseptic. A timeout was performed prior to the initiation of the procedure. The patient was placed prone on the exam table. Limited CT of the pelvis was performed for planning purposes. This demonstrated lytic lesion in the posterior left ilium. Skin entry site was marked, and the overlying skin was prepped and draped in the standard sterile fashion. Local  analgesia was obtained with 1% lidocaine. Using intermittent CT fluoroscopy, a 11 gauge bone biopsy needle was advanced towards the identified lesion and biopsy performed. Specimens were submitted in formalin to pathology for further handling. Attention was then turned to the contralateral side for bone marrow aspiration and core biopsy. Limited CT of the pelvis was performed for planning purposes. Skin entry site was marked, and the overlying skin was prepped and draped in the standard sterile fashion. Local analgesia was obtained with 1% lidocaine. Using CT guidance, an 11 gauge needle was advanced just deep to the cortex of the right posterior ilium. Subsequently, bone marrow aspiration and core biopsy were performed. Specimens were submitted to lab/pathology for handling. Hemostasis was achieved with manual pressure, and a clean dressing was placed. The patient tolerated the procedure well without immediate complication. IMPRESSION: 1. Successful CT-guided focal bone core needle biopsy of lytic lesion left posterior ilium. 2. Successful CT-guided bone marrow aspiration and core biopsy of the right posterior ilium. Electronically Signed   By: Olive Bass M.D.   On: 08/14/2022 13:31   DG Abd Portable 1V  Result Date: 08/14/2022 CLINICAL DATA:  Constipation EXAM: PORTABLE ABDOMEN - 1 VIEW COMPARISON:  None Available. FINDINGS: Scattered stool throughout colon, grossly normal stool burden. Small amount gas and stool in rectum. No bowel dilatation or bowel wall thickening. Bones demineralized. IMPRESSION: Nonobstructive bowel gas pattern. Electronically Signed   By: Ulyses Southward M.D.   On: 08/14/2022 12:27      Signature  -   Susa Raring M.D on 08/17/2022 at 7:50 AM   -  To page go to [email or web address]

## 2022-08-17 NOTE — Plan of Care (Signed)

## 2022-08-17 NOTE — Progress Notes (Addendum)
[facility] Progress Note  Name: Rachael Lee MRN: [id number] DOB: Apr 26, 1965  Chief Complaint:  Abdominal pain and back pain   Subjective:  She had 1.4 liters UOP over 4/27 as well as 6 charted unmeasured urine voids.  She had a bone lesion biopsy as well as a bone marrow biopsy on 4/25 - path is pending.  Hem/onc was consulted and they plan to start chemo this week as an inpatient.  She got rasburicase yesterday    Review of systems:    She states that she is a little short of breath.  She denies chest pain  Denies n/v.   She's glad foley is out  Still with some abdominal pain but she feels is typical for her ------------- Background on referral:  Karrina Lye is a 57 y.o. female with a history of HTN, DM, prior CVA, and asthma who presented to the hospital with abdominal pain as well as back pain for the past week and a half.  She is here visiting family and lives out of state in [place].  I spoke with her son on the phone in the room via speakerphone.  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  She is still having the abdominal pain.  She denies any NSAID use.  She does take aspirin daily OTC.  She was found to have hypercalcemia as well as acute kidney injury.  Her presenting calcium was greater than 15. high. She has been given calcitonin x 2 doses and has been on normal saline.  Her creatinine is improving with hydration.  Unfortunately, other work-up has revealed scattered lytic lesions throughout the lumbar spine and pelvis suspicious for metastatic disease or myeloma.  Note that she had a stroke 2 years ago and has expressive aphasia secondary to the same as well as right-sided weakness.  Nephrology is consulted for assistance with management of hypercalcemia and AKI.  She had 1.5 liters UOP over 4/23 and has had 820 mL uop thus far today.   CT a/p without hydro; mild bilateral non-obstructing nephrolithiasis.  Nursing states  bone marrow biopsy is scheduled for tomorrow.      Intake/Output Summary (Last 24 hours) at 08/17/2022 1243 Last data filed at 08/17/2022 1200 Gross per 24 hour  Intake 1465.45 ml  Output 800 ml  Net 665.45 ml    Vitals:  Vitals:   08/16/22 2343 08/17/22 0553 08/17/22 0802 08/17/22 1236  BP:   133/80 116/61  Pulse: 81  63 77  Resp:   16 17  Temp: 98.2 F (36.8 C) 97.7 F (36.5 C) 98.4 F (36.9 C) 98.7 F (37.1 C)  TempSrc: Oral Oral Oral Oral  SpO2:   99% 95%  Weight:      Height:         Physical Exam:   General:  adult female in bed in NAD  HEENT: NCAT Eyes: EOMI  Neck: sclera anicteric Heart: supple trachea midline  Lungs: clear to auscultation; normal work of breathing on room air  Abdomen: soft/nt/obese habitus/distended  Extremities: no to trace edema lower extremities  Skin: no rash on extremities exposed  Psych no anxiety or agitation  Neuro: patient is awake and interactive; expressive aphasia but motions with her hands. Limited speech; on gross exam patient with right sided weakness GU she has a purewick in place  Medications reviewed   Labs:     Latest Ref Rng & Units 08/17/2022    2:19 AM 08/16/2022  3:15 AM 08/15/2022    5:36 AM  BMP  Glucose 70 - 99 mg/dL 161  91  88   BUN 6 - 20 mg/dL 30  26  27    Creatinine 0.44 - 1.00 mg/dL 0.96  0.45  4.09   Sodium 135 - 145 mmol/L 137  138  137   Potassium 3.5 - 5.1 mmol/L 4.1  3.9  3.7   Chloride 98 - 111 mmol/L 109  109  110   CO2 22 - 32 mmol/L 22  22  23    Calcium 8.9 - 10.3 mg/dL 9.8  81.1  9.7      Assessment/Plan:   # Hypercalcemia  - Constellation of symptoms worrisome for malignancy. PTH appropriately low.  Extremely low kappa/lamba free light chains ratio suggestive of plasma cell dyscrasia.  Note that PTHrp was sent as well.  - Continue normal saline at 75 ml/hr for another 24 hours - SPEP was normal but UPEP with M spike - Bence Jones Protein positive, lambda type and kappa type noted -  Appreciate hem/onc    # AKI  - prerenal and improving with hydration; setting of marked hypercalcemia and concern for plasma cell dyscrasia - note baseline Cr 1.1 per 01/2021 Care Everywhere records  - Improved with hydration but we have hit a plateau.  - Monitor for retention - check post-void residual bladder scan and in/out cath if indicated   # Lytic spine lesions  - Bone marrow biopsy was performed with path pending   # HTN  - Noted that she has had some hypotension and we stopped amlodipine for same (had been initiated this admission)   # Hx Stroke - residual expressive aphasia and right-sided weakness - HTN control as above  - she is on aspirin and ticagrelor per primary team   # Normocytic Anemia  - in setting of malignancy   # Hyperuricemia  - Per primary and hem/onc - Watch for TLS  - s/p hydration as tolerated - Would consider allopurinol per primary team discretion.  Hem/onc gave rasburicase on 4/27 with repeat uric acid for 4/29  Disposition - would continue inpatient monitoring.  Renal will sign off tomorrow if renal function is stable   Estanislado Emms, MD 08/17/2022 1:00 PM

## 2022-08-17 NOTE — Progress Notes (Signed)
Mobility Specialist - Progress Note   08/17/22 1237  Mobility  Activity  (Seated level exercise)  Level of Assistance Standby assist, set-up cues, supervision of patient - no hands on  Assistive Device None  Activity Response Tolerated well  Mobility Referral Yes  $Mobility charge 1 Mobility   Pt was received in bed and originally agreeable to OOB mobility. Upon removing patients cover and instructing her to start moving her leg to EOB pt refused and said she did not want to. Pt was then agreeable bed level exercises. Pt was left in bed with all needs met.   Alda Lea  Mobility Specialist Please contact via Special educational needs teacher or Rehab office at [phone]

## 2022-08-18 ENCOUNTER — Encounter: Payer: Self-pay | Admitting: Hematology and Oncology

## 2022-08-18 ENCOUNTER — Other Ambulatory Visit: Payer: Self-pay | Admitting: Hematology and Oncology

## 2022-08-18 ENCOUNTER — Other Ambulatory Visit: Payer: Self-pay

## 2022-08-18 DIAGNOSIS — C9000 Multiple myeloma not having achieved remission: Secondary | ICD-10-CM

## 2022-08-18 DIAGNOSIS — N179 Acute kidney failure, unspecified: Secondary | ICD-10-CM | POA: Diagnosis not present

## 2022-08-18 DIAGNOSIS — E559 Vitamin D deficiency, unspecified: Secondary | ICD-10-CM | POA: Insufficient documentation

## 2022-08-18 LAB — CBC WITH DIFFERENTIAL/PLATELET
Abs Immature Granulocytes: 0.04 10*3/uL (ref 0.00–0.07)
Basophils Absolute: 0 10*3/uL (ref 0.0–0.1)
Basophils Relative: 0 %
Eosinophils Absolute: 0.5 10*3/uL (ref 0.0–0.5)
Eosinophils Relative: 6 %
HCT: 24.9 % — ABNORMAL LOW (ref 36.0–46.0)
Hemoglobin: 8.3 g/dL — ABNORMAL LOW (ref 12.0–15.0)
Immature Granulocytes: 0 %
Lymphocytes Relative: 39 %
Lymphs Abs: 3.5 10*3/uL (ref 0.7–4.0)
MCH: 28.7 pg (ref 26.0–34.0)
MCHC: 33.3 g/dL (ref 30.0–36.0)
MCV: 86.2 fL (ref 80.0–100.0)
Monocytes Absolute: 0.5 10*3/uL (ref 0.1–1.0)
Monocytes Relative: 6 %
Neutro Abs: 4.4 10*3/uL (ref 1.7–7.7)
Neutrophils Relative %: 49 %
Platelets: 270 10*3/uL (ref 150–400)
RBC: 2.89 MIL/uL — ABNORMAL LOW (ref 3.87–5.11)
RDW: 13.9 % (ref 11.5–15.5)
WBC: 9 10*3/uL (ref 4.0–10.5)
nRBC: 0 % (ref 0.0–0.2)

## 2022-08-18 LAB — COMPREHENSIVE METABOLIC PANEL
ALT: 18 U/L (ref 0–44)
AST: 11 U/L — ABNORMAL LOW (ref 15–41)
Albumin: 2.6 g/dL — ABNORMAL LOW (ref 3.5–5.0)
Alkaline Phosphatase: 54 U/L (ref 38–126)
Anion gap: 9 (ref 5–15)
BUN: 30 mg/dL — ABNORMAL HIGH (ref 6–20)
CO2: 21 mmol/L — ABNORMAL LOW (ref 22–32)
Calcium: 9.5 mg/dL (ref 8.9–10.3)
Chloride: 111 mmol/L (ref 98–111)
Creatinine, Ser: 1.95 mg/dL — ABNORMAL HIGH (ref 0.44–1.00)
GFR, Estimated: 29 mL/min — ABNORMAL LOW (ref >60)
Glucose, Bld: 133 mg/dL — ABNORMAL HIGH (ref 70–99)
Potassium: 3.9 mmol/L (ref 3.5–5.1)
Sodium: 141 mmol/L (ref 135–145)
Total Bilirubin: 0.3 mg/dL (ref 0.3–1.2)
Total Protein: 5.1 g/dL — ABNORMAL LOW (ref 6.5–8.1)

## 2022-08-18 LAB — URIC ACID: Uric Acid, Serum: <1.5 mg/dL — ABNORMAL LOW (ref 2.5–7.1)

## 2022-08-18 LAB — GLUCOSE, CAPILLARY
Glucose-Capillary: 121 mg/dL — ABNORMAL HIGH (ref 70–99)
Glucose-Capillary: 89 mg/dL (ref 70–99)
Glucose-Capillary: 102 mg/dL — ABNORMAL HIGH (ref 70–99)

## 2022-08-18 LAB — BRAIN NATRIURETIC PEPTIDE: B Natriuretic Peptide: 117.8 pg/mL — ABNORMAL HIGH (ref 0.0–100.0)

## 2022-08-18 LAB — MAGNESIUM: Magnesium: 1.8 mg/dL (ref 1.7–2.4)

## 2022-08-18 LAB — PTH-RELATED PEPTIDE: PTH-related peptide: <2 pmol/L

## 2022-08-18 LAB — SURGICAL PATHOLOGY

## 2022-08-18 MED ORDER — DEXAMETHASONE 4 MG PO TABS
40.0000 mg | ORAL_TABLET | ORAL | Status: DC
  Administered 2022-08-19: 40 mg via ORAL
  Filled 2022-08-18: qty 10

## 2022-08-18 MED ORDER — VITAMIN D (ERGOCALCIFEROL) 1.25 MG (50000 UNIT) PO CAPS
50000.0000 [IU] | ORAL_CAPSULE | ORAL | Status: DC
  Administered 2022-08-19: 50000 [IU] via ORAL
  Filled 2022-08-18 (×2): qty 1

## 2022-08-18 NOTE — Plan of Care (Signed)

## 2022-08-18 NOTE — Plan of Care (Signed)
Problem: Education: Goal: Knowledge of General Education information will improve Description: Including pain rating scale, medication(s)/side effects and non-pharmacologic comfort measures 08/18/2022 0130 by Breck Coons, RN Outcome: Progressing 08/18/2022 0125 by Breck Coons, RN Outcome: Progressing 08/18/2022 0121 by Breck Coons, RN Outcome: Progressing   Problem: Health Behavior/Discharge Planning: Goal: Ability to manage health-related needs will improve 08/18/2022 0130 by Breck Coons, RN Outcome: Progressing 08/18/2022 0125 by Breck Coons, RN Outcome: Progressing 08/18/2022 0121 by Breck Coons, RN Outcome: Progressing   Problem: Clinical Measurements: Goal: Ability to maintain clinical measurements within normal limits will improve 08/18/2022 0130 by Breck Coons, RN Outcome: Progressing 08/18/2022 0125 by Breck Coons, RN Outcome: Progressing 08/18/2022 0121 by Breck Coons, RN Outcome: Progressing Goal: Will remain free from infection 08/18/2022 0130 by Breck Coons, RN Outcome: Progressing 08/18/2022 0125 by Breck Coons, RN Outcome: Progressing 08/18/2022 0121 by Breck Coons, RN Outcome: Progressing Goal: Diagnostic test results will improve 08/18/2022 0130 by Breck Coons, RN Outcome: Progressing 08/18/2022 0125 by Breck Coons, RN Outcome: Progressing 08/18/2022 0121 by Breck Coons, RN Outcome: Progressing Goal: Respiratory complications will improve 08/18/2022 0130 by Breck Coons, RN Outcome: Progressing 08/18/2022 0125 by Breck Coons, RN Outcome: Progressing 08/18/2022 0121 by Breck Coons, RN Outcome: Progressing Goal: Cardiovascular complication will be avoided 08/18/2022 0130 by Breck Coons, RN Outcome: Progressing 08/18/2022 0125 by Breck Coons, RN Outcome: Progressing 08/18/2022 0121 by  Breck Coons, RN Outcome: Progressing   Problem: Activity: Goal: Risk for activity intolerance will decrease 08/18/2022 0130 by Breck Coons, RN Outcome: Progressing 08/18/2022 0125 by Breck Coons, RN Outcome: Progressing 08/18/2022 0121 by Breck Coons, RN Outcome: Progressing   Problem: Nutrition: Goal: Adequate nutrition will be maintained 08/18/2022 0130 by Breck Coons, RN Outcome: Progressing 08/18/2022 0125 by Breck Coons, RN Outcome: Progressing 08/18/2022 0121 by Breck Coons, RN Outcome: Progressing   Problem: Coping: Goal: Level of anxiety will decrease 08/18/2022 0130 by Breck Coons, RN Outcome: Progressing 08/18/2022 0125 by Breck Coons, RN Outcome: Progressing 08/18/2022 0121 by Breck Coons, RN Outcome: Progressing   Problem: Elimination: Goal: Will not experience complications related to bowel motility 08/18/2022 0130 by Breck Coons, RN Outcome: Progressing 08/18/2022 0125 by Breck Coons, RN Outcome: Progressing 08/18/2022 0121 by Breck Coons, RN Outcome: Progressing Goal: Will not experience complications related to urinary retention 08/18/2022 0130 by Breck Coons, RN Outcome: Progressing 08/18/2022 0125 by Breck Coons, RN Outcome: Progressing 08/18/2022 0121 by Breck Coons, RN Outcome: Progressing   Problem: Pain Managment: Goal: General experience of comfort will improve 08/18/2022 0130 by Breck Coons, RN Outcome: Progressing 08/18/2022 0125 by Breck Coons, RN Outcome: Progressing 08/18/2022 0121 by Breck Coons, RN Outcome: Progressing   Problem: Safety: Goal: Ability to remain free from injury will improve 08/18/2022 0130 by Breck Coons, RN Outcome: Progressing 08/18/2022 0125 by Breck Coons, RN Outcome: Progressing 08/18/2022 0121 by Breck Coons, RN Outcome:  Progressing   Problem: Skin Integrity: Goal: Risk for impaired skin integrity will decrease 08/18/2022 0130 by Breck Coons, RN Outcome: Progressing 08/18/2022 0125 by Breck Coons, RN Outcome: Progressing 08/18/2022 0121 by Breck Coons, RN Outcome: Progressing   Problem: Education: Goal: Ability to describe self-care measures that may prevent or decrease complications (Diabetes Survival Skills Education) will improve 08/18/2022 0130 by Breck Coons,  RN Outcome: Progressing 08/18/2022 0125 by Breck Coons, RN Outcome: Progressing 08/18/2022 0121 by Breck Coons, RN Outcome: Progressing Goal: Individualized Educational Video(s) 08/18/2022 0130 by Breck Coons, RN Outcome: Progressing 08/18/2022 0125 by Breck Coons, RN Outcome: Progressing 08/18/2022 0121 by Breck Coons, RN Outcome: Progressing   Problem: Coping: Goal: Ability to adjust to condition or change in health will improve 08/18/2022 0130 by Breck Coons, RN Outcome: Progressing 08/18/2022 0125 by Breck Coons, RN Outcome: Progressing 08/18/2022 0121 by Breck Coons, RN Outcome: Progressing   Problem: Fluid Volume: Goal: Ability to maintain a balanced intake and output will improve 08/18/2022 0130 by Breck Coons, RN Outcome: Progressing 08/18/2022 0125 by Breck Coons, RN Outcome: Progressing 08/18/2022 0121 by Breck Coons, RN Outcome: Progressing   Problem: Health Behavior/Discharge Planning: Goal: Ability to identify and utilize available resources and services will improve 08/18/2022 0130 by Breck Coons, RN Outcome: Progressing 08/18/2022 0125 by Breck Coons, RN Outcome: Progressing 08/18/2022 0121 by Breck Coons, RN Outcome: Progressing Goal: Ability to manage health-related needs will improve 08/18/2022 0130 by Breck Coons, RN Outcome: Progressing 08/18/2022  0125 by Breck Coons, RN Outcome: Progressing 08/18/2022 0121 by Breck Coons, RN Outcome: Progressing   Problem: Metabolic: Goal: Ability to maintain appropriate glucose levels will improve 08/18/2022 0130 by Breck Coons, RN Outcome: Progressing 08/18/2022 0125 by Breck Coons, RN Outcome: Progressing 08/18/2022 0121 by Breck Coons, RN Outcome: Progressing   Problem: Nutritional: Goal: Maintenance of adequate nutrition will improve 08/18/2022 0130 by Breck Coons, RN Outcome: Progressing 08/18/2022 0125 by Breck Coons, RN Outcome: Progressing 08/18/2022 0121 by Breck Coons, RN Outcome: Progressing Goal: Progress toward achieving an optimal weight will improve 08/18/2022 0130 by Breck Coons, RN Outcome: Progressing 08/18/2022 0125 by Breck Coons, RN Outcome: Progressing 08/18/2022 0121 by Breck Coons, RN Outcome: Progressing   Problem: Skin Integrity: Goal: Risk for impaired skin integrity will decrease 08/18/2022 0130 by Breck Coons, RN Outcome: Progressing 08/18/2022 0125 by Breck Coons, RN Outcome: Progressing 08/18/2022 0121 by Breck Coons, RN Outcome: Progressing   Problem: Tissue Perfusion: Goal: Adequacy of tissue perfusion will improve 08/18/2022 0130 by Breck Coons, RN Outcome: Progressing 08/18/2022 0125 by Breck Coons, RN Outcome: Progressing 08/18/2022 0121 by Breck Coons, RN Outcome: Progressing

## 2022-08-18 NOTE — Progress Notes (Signed)
Inpatient Rehab Admissions Coordinator:   Note plans to transition to [facility] for inpatient chemo.  She is doing well with mobility (min guard 150' with RW).  Will follow from a distance for progress with mobility while she's being treated.    Estill Dooms, PT, DPT Admissions Coordinator [phone] 08/18/22  10:30 AM

## 2022-08-18 NOTE — Plan of Care (Signed)
Problem: Education: Goal: Knowledge of General Education information will improve Description: Including pain rating scale, medication(s)/side effects and non-pharmacologic comfort measures 08/18/2022 0134 by Breck Coons, RN Outcome: Progressing 08/18/2022 0133 by Breck Coons, RN Outcome: Progressing 08/18/2022 0130 by Breck Coons, RN Outcome: Progressing 08/18/2022 0125 by Breck Coons, RN Outcome: Progressing 08/18/2022 0121 by Breck Coons, RN Outcome: Progressing   Problem: Health Behavior/Discharge Planning: Goal: Ability to manage health-related needs will improve 08/18/2022 0134 by Breck Coons, RN Outcome: Progressing 08/18/2022 0133 by Breck Coons, RN Outcome: Progressing 08/18/2022 0130 by Breck Coons, RN Outcome: Progressing 08/18/2022 0125 by Breck Coons, RN Outcome: Progressing 08/18/2022 0121 by Breck Coons, RN Outcome: Progressing   Problem: Clinical Measurements: Goal: Ability to maintain clinical measurements within normal limits will improve 08/18/2022 0134 by Breck Coons, RN Outcome: Progressing 08/18/2022 0133 by Breck Coons, RN Outcome: Progressing 08/18/2022 0130 by Breck Coons, RN Outcome: Progressing 08/18/2022 0125 by Breck Coons, RN Outcome: Progressing 08/18/2022 0121 by Breck Coons, RN Outcome: Progressing Goal: Will remain free from infection 08/18/2022 0134 by Breck Coons, RN Outcome: Progressing 08/18/2022 0133 by Breck Coons, RN Outcome: Progressing 08/18/2022 0130 by Breck Coons, RN Outcome: Progressing 08/18/2022 0125 by Breck Coons, RN Outcome: Progressing 08/18/2022 0121 by Breck Coons, RN Outcome: Progressing Goal: Diagnostic test results will improve 08/18/2022 0134 by Breck Coons, RN Outcome: Progressing 08/18/2022 0133 by Breck Coons, RN Outcome:  Progressing 08/18/2022 0130 by Breck Coons, RN Outcome: Progressing 08/18/2022 0125 by Breck Coons, RN Outcome: Progressing 08/18/2022 0121 by Breck Coons, RN Outcome: Progressing Goal: Respiratory complications will improve 08/18/2022 0134 by Breck Coons, RN Outcome: Progressing 08/18/2022 0133 by Breck Coons, RN Outcome: Progressing 08/18/2022 0130 by Breck Coons, RN Outcome: Progressing 08/18/2022 0125 by Breck Coons, RN Outcome: Progressing 08/18/2022 0121 by Breck Coons, RN Outcome: Progressing Goal: Cardiovascular complication will be avoided 08/18/2022 0134 by Breck Coons, RN Outcome: Progressing 08/18/2022 0133 by Breck Coons, RN Outcome: Progressing 08/18/2022 0130 by Breck Coons, RN Outcome: Progressing 08/18/2022 0125 by Breck Coons, RN Outcome: Progressing 08/18/2022 0121 by Breck Coons, RN Outcome: Progressing   Problem: Activity: Goal: Risk for activity intolerance will decrease 08/18/2022 0134 by Breck Coons, RN Outcome: Progressing 08/18/2022 0133 by Breck Coons, RN Outcome: Progressing 08/18/2022 0130 by Breck Coons, RN Outcome: Progressing 08/18/2022 0125 by Breck Coons, RN Outcome: Progressing 08/18/2022 0121 by Breck Coons, RN Outcome: Progressing   Problem: Nutrition: Goal: Adequate nutrition will be maintained 08/18/2022 0134 by Breck Coons, RN Outcome: Progressing 08/18/2022 0133 by Breck Coons, RN Outcome: Progressing 08/18/2022 0130 by Breck Coons, RN Outcome: Progressing 08/18/2022 0125 by Breck Coons, RN Outcome: Progressing 08/18/2022 0121 by Breck Coons, RN Outcome: Progressing   Problem: Coping: Goal: Level of anxiety will decrease 08/18/2022 0134 by Breck Coons, RN Outcome: Progressing 08/18/2022 0133 by Breck Coons, RN Outcome:  Progressing 08/18/2022 0130 by Breck Coons, RN Outcome: Progressing 08/18/2022 0125 by Breck Coons, RN Outcome: Progressing 08/18/2022 0121 by Breck Coons, RN Outcome: Progressing   Problem: Elimination: Goal: Will not experience complications related to bowel motility 08/18/2022 0134 by Breck Coons, RN Outcome: Progressing 08/18/2022 0133 by Breck Coons, RN Outcome: Progressing 08/18/2022 0130 by Breck Coons, RN Outcome: Progressing 08/18/2022 0125 by  Breck Coons, RN Outcome: Progressing 08/18/2022 0121 by Breck Coons, RN Outcome: Progressing Goal: Will not experience complications related to urinary retention 08/18/2022 0134 by Breck Coons, RN Outcome: Progressing 08/18/2022 0133 by Breck Coons, RN Outcome: Progressing 08/18/2022 0130 by Breck Coons, RN Outcome: Progressing 08/18/2022 0125 by Breck Coons, RN Outcome: Progressing 08/18/2022 0121 by Breck Coons, RN Outcome: Progressing   Problem: Pain Managment: Goal: General experience of comfort will improve 08/18/2022 0134 by Breck Coons, RN Outcome: Progressing 08/18/2022 0133 by Breck Coons, RN Outcome: Progressing 08/18/2022 0130 by Breck Coons, RN Outcome: Progressing 08/18/2022 0125 by Breck Coons, RN Outcome: Progressing 08/18/2022 0121 by Breck Coons, RN Outcome: Progressing   Problem: Safety: Goal: Ability to remain free from injury will improve 08/18/2022 0134 by Breck Coons, RN Outcome: Progressing 08/18/2022 0133 by Breck Coons, RN Outcome: Progressing 08/18/2022 0130 by Breck Coons, RN Outcome: Progressing 08/18/2022 0125 by Breck Coons, RN Outcome: Progressing 08/18/2022 0121 by Breck Coons, RN Outcome: Progressing   Problem: Skin Integrity: Goal: Risk for impaired skin integrity will decrease 08/18/2022 0134 by  Breck Coons, RN Outcome: Progressing 08/18/2022 0133 by Breck Coons, RN Outcome: Progressing 08/18/2022 0130 by Breck Coons, RN Outcome: Progressing 08/18/2022 0125 by Breck Coons, RN Outcome: Progressing 08/18/2022 0121 by Breck Coons, RN Outcome: Progressing   Problem: Education: Goal: Ability to describe self-care measures that may prevent or decrease complications (Diabetes Survival Skills Education) will improve 08/18/2022 0134 by Breck Coons, RN Outcome: Progressing 08/18/2022 0133 by Breck Coons, RN Outcome: Progressing 08/18/2022 0130 by Breck Coons, RN Outcome: Progressing 08/18/2022 0125 by Breck Coons, RN Outcome: Progressing 08/18/2022 0121 by Breck Coons, RN Outcome: Progressing Goal: Individualized Educational Video(s) 08/18/2022 0134 by Breck Coons, RN Outcome: Progressing 08/18/2022 0133 by Breck Coons, RN Outcome: Progressing 08/18/2022 0130 by Breck Coons, RN Outcome: Progressing 08/18/2022 0125 by Breck Coons, RN Outcome: Progressing 08/18/2022 0121 by Breck Coons, RN Outcome: Progressing   Problem: Coping: Goal: Ability to adjust to condition or change in health will improve 08/18/2022 0134 by Breck Coons, RN Outcome: Progressing 08/18/2022 0133 by Breck Coons, RN Outcome: Progressing 08/18/2022 0130 by Breck Coons, RN Outcome: Progressing 08/18/2022 0125 by Breck Coons, RN Outcome: Progressing 08/18/2022 0121 by Breck Coons, RN Outcome: Progressing   Problem: Fluid Volume: Goal: Ability to maintain a balanced intake and output will improve 08/18/2022 0134 by Breck Coons, RN Outcome: Progressing 08/18/2022 0133 by Breck Coons, RN Outcome: Progressing 08/18/2022 0130 by Breck Coons, RN Outcome: Progressing 08/18/2022 0125 by Breck Coons, RN Outcome:  Progressing 08/18/2022 0121 by Breck Coons, RN Outcome: Progressing   Problem: Health Behavior/Discharge Planning: Goal: Ability to identify and utilize available resources and services will improve 08/18/2022 0134 by Breck Coons, RN Outcome: Progressing 08/18/2022 0133 by Breck Coons, RN Outcome: Progressing 08/18/2022 0130 by Breck Coons, RN Outcome: Progressing 08/18/2022 0125 by Breck Coons, RN Outcome: Progressing 08/18/2022 0121 by Breck Coons, RN Outcome: Progressing Goal: Ability to manage health-related needs will improve 08/18/2022 0134 by Breck Coons, RN Outcome: Progressing 08/18/2022 0133 by Breck Coons, RN Outcome: Progressing 08/18/2022 0130 by Breck Coons, RN Outcome: Progressing 08/18/2022 0125 by Breck Coons, RN Outcome: Progressing 08/18/2022 0121 by Breck Coons, RN Outcome: Progressing  Problem: Metabolic: Goal: Ability to maintain appropriate glucose levels will improve 08/18/2022 0134 by Breck Coons, RN Outcome: Progressing 08/18/2022 0133 by Breck Coons, RN Outcome: Progressing 08/18/2022 0130 by Breck Coons, RN Outcome: Progressing 08/18/2022 0125 by Breck Coons, RN Outcome: Progressing 08/18/2022 0121 by Breck Coons, RN Outcome: Progressing   Problem: Nutritional: Goal: Maintenance of adequate nutrition will improve 08/18/2022 0134 by Breck Coons, RN Outcome: Progressing 08/18/2022 0133 by Breck Coons, RN Outcome: Progressing 08/18/2022 0130 by Breck Coons, RN Outcome: Progressing 08/18/2022 0125 by Breck Coons, RN Outcome: Progressing 08/18/2022 0121 by Breck Coons, RN Outcome: Progressing Goal: Progress toward achieving an optimal weight will improve 08/18/2022 0134 by Breck Coons, RN Outcome: Progressing 08/18/2022 0133 by Breck Coons, RN Outcome:  Progressing 08/18/2022 0130 by Breck Coons, RN Outcome: Progressing 08/18/2022 0125 by Breck Coons, RN Outcome: Progressing 08/18/2022 0121 by Breck Coons, RN Outcome: Progressing   Problem: Skin Integrity: Goal: Risk for impaired skin integrity will decrease 08/18/2022 0134 by Breck Coons, RN Outcome: Progressing 08/18/2022 0133 by Breck Coons, RN Outcome: Progressing 08/18/2022 0130 by Breck Coons, RN Outcome: Progressing 08/18/2022 0125 by Breck Coons, RN Outcome: Progressing 08/18/2022 0121 by Breck Coons, RN Outcome: Progressing   Problem: Tissue Perfusion: Goal: Adequacy of tissue perfusion will improve 08/18/2022 0134 by Breck Coons, RN Outcome: Progressing 08/18/2022 0133 by Breck Coons, RN Outcome: Progressing 08/18/2022 0130 by Breck Coons, RN Outcome: Progressing 08/18/2022 0125 by Breck Coons, RN Outcome: Progressing 08/18/2022 0121 by Breck Coons, RN Outcome: Progressing

## 2022-08-18 NOTE — Progress Notes (Signed)
Occupational Therapy Treatment Patient Details Name: Rachael Lee MRN: [id number] DOB: Apr 21, 1966 Today's Date: 08/18/2022   History of present illness Rachael Lee is a 57 y/o woman presented with abdominal pain for about 1 day prior to admission. She has had back pain for about a week and a half since she came to [place] from [place]. She is visiting family. Found to have  calcium greater than 15 and acute kidney injury. 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 She is very motivated to get up and moving. Pt will continue to benefit from acute OT with follow up from intensive inpatient follow up therapy, >3 hours/day    Recommendations for follow up therapy are one component of a multi-disciplinary discharge planning process, led by the attending physician.  Recommendations may be updated based on patient status, additional functional criteria and insurance authorization.    Assistance Recommended at Discharge Frequent or constant Supervision/Assistance  Patient can return home with the following  A little help with walking and/or transfers;A lot of help with bathing/dressing/bathroom;Assistance with cooking/housework;Assist for transportation;Help with stairs or ramp for entrance;Direct supervision/assist for financial management;Direct supervision/assist for medications management   Equipment Recommendations  BSC/3in1 (son to get tub bench)    Recommendations for Other Services Rehab consult    Precautions / Restrictions Precautions Precautions: Fall Restrictions Weight Bearing Restrictions: No       Mobility Bed Mobility Overal bed mobility: Modified Independent (HOB, no need for rail)                  Transfers Overall transfer level: Needs assistance Equipment used: Rolling walker (2  wheels) Transfers: Sit to/from Stand Sit to Stand: Min guard                 Balance Overall balance assessment: Needs assistance Sitting-balance support: No upper extremity supported, Feet supported Sitting balance-Rachael Lee: Good     Standing balance support: Bilateral upper extremity supported Standing balance-Rachael Lee: Poor Standing balance comment: propped on sink to wash hands                           ADL either performed or assessed with clinical judgement   ADL Overall ADL's : Needs assistance/impaired Eating/Feeding: Set up;Sitting   Grooming: Min guard;Standing;Wash/dry Electrical engineer Transfer: Min guard;Ambulation;Regular Toilet;Grab bars;Rolling walker (2 wheels)   Toileting- Clothing Manipulation and Hygiene: Min guard;Sit to/from stand Toileting - Clothing Manipulation Details (indicate cue type and reason): for front peri care            Extremity/Trunk Assessment Upper Extremity Assessment Upper Extremity Assessment: RUE deficits/detail RUE Deficits / Details: old CVA with decreased AROM all joints but can open and close hand when she concentrates on it. RUE Coordination: decreased fine motor;decreased gross motor       Cervical / Trunk Assessment Cervical / Trunk Assessment: Kyphotic    Vision Baseline Vision/History: 1 Wears glasses Ability to See in Adequate Light: 0 Adequate Patient Visual Report: No change from baseline Additional Comments: reports she uses glasses for reading and sometimes for distance, reports no issues with vision from prior CVAs--able to find all items asked of her in room.  Cognition Arousal/Alertness: Awake/alert Behavior During Therapy: WFL for tasks assessed/performed Overall Cognitive Status: Within Functional Limits for tasks assessed                                                     Pertinent Vitals/ Pain       Pain Assessment Pain  Assessment: No/denies pain         Frequency  Min 2X/week        Progress Toward Goals  OT Goals(current goals can now be found in the care plan section)  Progress towards OT goals: Progressing toward goals  Acute Rehab OT Goals Patient Stated Goal: to get better and to go live with son OT Goal Formulation: With patient Time For Goal Achievement: 08/27/22 Potential to Achieve Goals: Good  Plan Discharge plan remains appropriate       AM-PAC OT "6 Clicks" Daily Activity     Outcome Measure   Help from another person eating meals?: A Little Help from another person taking care of personal grooming?: A Little Help from another person toileting, which includes using toliet, bedpan, or urinal?: A Little Help from another person bathing (including washing, rinsing, drying)?: A Little Help from another person to put on and taking off regular upper body clothing?: A Little Help from another person to put on and taking off regular lower body clothing?: A Little 6 Click Score: 18    End of Session Equipment Utilized During Treatment: Gait belt;Rolling walker (2 wheels)  OT Visit Diagnosis: Unsteadiness on feet (R26.81);Other abnormalities of gait and mobility (R26.89);Muscle weakness (generalized) (M62.81);Low vision, both eyes (H54.2);Other symptoms and signs involving cognitive function;Hemiplegia and hemiparesis;Cognitive communication deficit (R41.841) Symptoms and signs involving cognitive functions: Cerebral infarction Hemiplegia - Right/Left: Right Hemiplegia - dominant/non-dominant: Dominant Hemiplegia - caused by: Cerebral infarction   Activity Tolerance Patient tolerated treatment well   Patient Left in chair;with call bell/phone within reach;with chair alarm set   Nurse Communication  RN in to check IV that had come out while pt was in bed.        Time: 1610-9604 OT Time Calculation (min): 34 min  Charges: OT General Charges $OT Visit: 1 Visit OT  Treatments $Self Care/Home Management : 23-37 mins  Lindon Romp OT Acute Rehabilitation Services Office [phone]    Evette Georges 08/18/2022, 1:04 PM

## 2022-08-18 NOTE — Progress Notes (Signed)
START ON PATHWAY REGIMEN - Multiple Myeloma and Other Plasma Cell Dyscrasias     A cycle is every 21 days:     Dexamethasone      Bortezomib      Cyclophosphamide   **Always confirm dose/schedule in your pharmacy ordering system**  Patient Characteristics: Multiple Myeloma, Newly Diagnosed, Transplant Ineligible or Refused, Unknown or Awaiting Test Results Disease Classification: Multiple Myeloma Therapeutic Status: Newly Diagnosed R2-ISS Staging: III Is Patient Eligible for Transplant<= Transplant Ineligible or Refused Risk Status: Awaiting Test Results Intent of Therapy: Non-Curative / Palliative Intent, Discussed with Patient

## 2022-08-18 NOTE — Progress Notes (Signed)
Received report on transfer patient from Clovis Fredrickson RN. Marland Kitchen

## 2022-08-18 NOTE — Plan of Care (Signed)
Problem: Education: Goal: Knowledge of General Education information will improve Description: Including pain rating scale, medication(s)/side effects and non-pharmacologic comfort measures 08/18/2022 0133 by Breck Coons, RN Outcome: Progressing 08/18/2022 0130 by Breck Coons, RN Outcome: Progressing 08/18/2022 0125 by Breck Coons, RN Outcome: Progressing 08/18/2022 0121 by Breck Coons, RN Outcome: Progressing   Problem: Health Behavior/Discharge Planning: Goal: Ability to manage health-related needs will improve 08/18/2022 0133 by Breck Coons, RN Outcome: Progressing 08/18/2022 0130 by Breck Coons, RN Outcome: Progressing 08/18/2022 0125 by Breck Coons, RN Outcome: Progressing 08/18/2022 0121 by Breck Coons, RN Outcome: Progressing   Problem: Clinical Measurements: Goal: Ability to maintain clinical measurements within normal limits will improve 08/18/2022 0133 by Breck Coons, RN Outcome: Progressing 08/18/2022 0130 by Breck Coons, RN Outcome: Progressing 08/18/2022 0125 by Breck Coons, RN Outcome: Progressing 08/18/2022 0121 by Breck Coons, RN Outcome: Progressing Goal: Will remain free from infection 08/18/2022 0133 by Breck Coons, RN Outcome: Progressing 08/18/2022 0130 by Breck Coons, RN Outcome: Progressing 08/18/2022 0125 by Breck Coons, RN Outcome: Progressing 08/18/2022 0121 by Breck Coons, RN Outcome: Progressing Goal: Diagnostic test results will improve 08/18/2022 0133 by Breck Coons, RN Outcome: Progressing 08/18/2022 0130 by Breck Coons, RN Outcome: Progressing 08/18/2022 0125 by Breck Coons, RN Outcome: Progressing 08/18/2022 0121 by Breck Coons, RN Outcome: Progressing Goal: Respiratory complications will improve 08/18/2022 0133 by Breck Coons, RN Outcome: Progressing 08/18/2022  0130 by Breck Coons, RN Outcome: Progressing 08/18/2022 0125 by Breck Coons, RN Outcome: Progressing 08/18/2022 0121 by Breck Coons, RN Outcome: Progressing Goal: Cardiovascular complication will be avoided 08/18/2022 0133 by Breck Coons, RN Outcome: Progressing 08/18/2022 0130 by Breck Coons, RN Outcome: Progressing 08/18/2022 0125 by Breck Coons, RN Outcome: Progressing 08/18/2022 0121 by Breck Coons, RN Outcome: Progressing   Problem: Activity: Goal: Risk for activity intolerance will decrease 08/18/2022 0133 by Breck Coons, RN Outcome: Progressing 08/18/2022 0130 by Breck Coons, RN Outcome: Progressing 08/18/2022 0125 by Breck Coons, RN Outcome: Progressing 08/18/2022 0121 by Breck Coons, RN Outcome: Progressing   Problem: Nutrition: Goal: Adequate nutrition will be maintained 08/18/2022 0133 by Breck Coons, RN Outcome: Progressing 08/18/2022 0130 by Breck Coons, RN Outcome: Progressing 08/18/2022 0125 by Breck Coons, RN Outcome: Progressing 08/18/2022 0121 by Breck Coons, RN Outcome: Progressing   Problem: Coping: Goal: Level of anxiety will decrease 08/18/2022 0133 by Breck Coons, RN Outcome: Progressing 08/18/2022 0130 by Breck Coons, RN Outcome: Progressing 08/18/2022 0125 by Breck Coons, RN Outcome: Progressing 08/18/2022 0121 by Breck Coons, RN Outcome: Progressing   Problem: Elimination: Goal: Will not experience complications related to bowel motility 08/18/2022 0133 by Breck Coons, RN Outcome: Progressing 08/18/2022 0130 by Breck Coons, RN Outcome: Progressing 08/18/2022 0125 by Breck Coons, RN Outcome: Progressing 08/18/2022 0121 by Breck Coons, RN Outcome: Progressing Goal: Will not experience complications related to urinary retention 08/18/2022 0133 by  Breck Coons, RN Outcome: Progressing 08/18/2022 0130 by Breck Coons, RN Outcome: Progressing 08/18/2022 0125 by Breck Coons, RN Outcome: Progressing 08/18/2022 0121 by Breck Coons, RN Outcome: Progressing   Problem: Pain Managment: Goal: General experience of comfort will improve 08/18/2022 0133 by Breck Coons, RN Outcome: Progressing 08/18/2022 0130 by Breck Coons, RN Outcome: Progressing 08/18/2022 0125 by Breck Coons, RN Outcome: Progressing  08/18/2022 0121 by Breck Coons, RN Outcome: Progressing   Problem: Safety: Goal: Ability to remain free from injury will improve 08/18/2022 0133 by Breck Coons, RN Outcome: Progressing 08/18/2022 0130 by Breck Coons, RN Outcome: Progressing 08/18/2022 0125 by Breck Coons, RN Outcome: Progressing 08/18/2022 0121 by Breck Coons, RN Outcome: Progressing   Problem: Skin Integrity: Goal: Risk for impaired skin integrity will decrease 08/18/2022 0133 by Breck Coons, RN Outcome: Progressing 08/18/2022 0130 by Breck Coons, RN Outcome: Progressing 08/18/2022 0125 by Breck Coons, RN Outcome: Progressing 08/18/2022 0121 by Breck Coons, RN Outcome: Progressing   Problem: Education: Goal: Ability to describe self-care measures that may prevent or decrease complications (Diabetes Survival Skills Education) will improve 08/18/2022 0133 by Breck Coons, RN Outcome: Progressing 08/18/2022 0130 by Breck Coons, RN Outcome: Progressing 08/18/2022 0125 by Breck Coons, RN Outcome: Progressing 08/18/2022 0121 by Breck Coons, RN Outcome: Progressing Goal: Individualized Educational Video(s) 08/18/2022 0133 by Breck Coons, RN Outcome: Progressing 08/18/2022 0130 by Breck Coons, RN Outcome: Progressing 08/18/2022 0125 by Breck Coons, RN Outcome:  Progressing 08/18/2022 0121 by Breck Coons, RN Outcome: Progressing   Problem: Coping: Goal: Ability to adjust to condition or change in health will improve 08/18/2022 0133 by Breck Coons, RN Outcome: Progressing 08/18/2022 0130 by Breck Coons, RN Outcome: Progressing 08/18/2022 0125 by Breck Coons, RN Outcome: Progressing 08/18/2022 0121 by Breck Coons, RN Outcome: Progressing   Problem: Fluid Volume: Goal: Ability to maintain a balanced intake and output will improve 08/18/2022 0133 by Breck Coons, RN Outcome: Progressing 08/18/2022 0130 by Breck Coons, RN Outcome: Progressing 08/18/2022 0125 by Breck Coons, RN Outcome: Progressing 08/18/2022 0121 by Breck Coons, RN Outcome: Progressing   Problem: Health Behavior/Discharge Planning: Goal: Ability to identify and utilize available resources and services will improve 08/18/2022 0133 by Breck Coons, RN Outcome: Progressing 08/18/2022 0130 by Breck Coons, RN Outcome: Progressing 08/18/2022 0125 by Breck Coons, RN Outcome: Progressing 08/18/2022 0121 by Breck Coons, RN Outcome: Progressing Goal: Ability to manage health-related needs will improve 08/18/2022 0133 by Breck Coons, RN Outcome: Progressing 08/18/2022 0130 by Breck Coons, RN Outcome: Progressing 08/18/2022 0125 by Breck Coons, RN Outcome: Progressing 08/18/2022 0121 by Breck Coons, RN Outcome: Progressing   Problem: Metabolic: Goal: Ability to maintain appropriate glucose levels will improve 08/18/2022 0133 by Breck Coons, RN Outcome: Progressing 08/18/2022 0130 by Breck Coons, RN Outcome: Progressing 08/18/2022 0125 by Breck Coons, RN Outcome: Progressing 08/18/2022 0121 by Breck Coons, RN Outcome: Progressing   Problem: Nutritional: Goal: Maintenance of adequate nutrition will  improve 08/18/2022 0133 by Breck Coons, RN Outcome: Progressing 08/18/2022 0130 by Breck Coons, RN Outcome: Progressing 08/18/2022 0125 by Breck Coons, RN Outcome: Progressing 08/18/2022 0121 by Breck Coons, RN Outcome: Progressing Goal: Progress toward achieving an optimal weight will improve 08/18/2022 0133 by Breck Coons, RN Outcome: Progressing 08/18/2022 0130 by Breck Coons, RN Outcome: Progressing 08/18/2022 0125 by Breck Coons, RN Outcome: Progressing 08/18/2022 0121 by Breck Coons, RN Outcome: Progressing   Problem: Skin Integrity: Goal: Risk for impaired skin integrity will decrease 08/18/2022 0133 by Breck Coons, RN Outcome: Progressing 08/18/2022 0130 by Breck Coons, RN Outcome: Progressing 08/18/2022 0125 by Breck Coons, RN Outcome: Progressing 08/18/2022 0121 by Breck Coons, RN Outcome: Progressing  Problem: Tissue Perfusion: Goal: Adequacy of tissue perfusion will improve 08/18/2022 0133 by Breck Coons, RN Outcome: Progressing 08/18/2022 0130 by Breck Coons, RN Outcome: Progressing 08/18/2022 0125 by Breck Coons, RN Outcome: Progressing 08/18/2022 0121 by Breck Coons, RN Outcome: Progressing

## 2022-08-18 NOTE — Progress Notes (Signed)
Patient arrived @ 1530

## 2022-08-18 NOTE — Progress Notes (Signed)
Patient ID: Ivett Luebbe, female   DOB: 1965-04-23, 57 y.o.   MRN: [id number] S: Feels well, no complaints.  Awaiting transfer to [facility] for inpatient chemotherapy. O:BP 125/60 (BP Location: Right Arm)   Pulse 72   Temp 98.4 F (36.9 C) (Oral)   Resp 15   Ht 5\' 3"  (1.6 m)   Wt 86.1 kg   SpO2 98%   BMI 33.62 kg/m   Intake/Output Summary (Last 24 hours) at 08/18/2022 1401 Last data filed at 08/18/2022 0300 Gross per 24 hour  Intake 462.45 ml  Output 900 ml  Net -437.55 ml   Intake/Output: I/O last 3 completed shifts: In: 1828.5 [I.V.:1828.5] Out: 900 [Urine:900]  Intake/Output this shift:  No intake/output data recorded. Weight change:  Gen: NAD CVS: RRR Resp: CTA Abd: +BS, soft, NT/ND Ext: no edema  Recent Labs  Lab 08/12/22 2021 08/13/22 0248 08/13/22 1308 08/13/22 1817 08/14/22 1116 08/15/22 0536 08/16/22 0315 08/17/22 0219 08/18/22 0245  NA 136 138 138 138 139 137 138 137 141  K 4.0 4.1 4.3 4.5 4.0 3.7 3.9 4.1 3.9  CL 101 107 103 105 108 110 109 109 111  CO2 22 22 19* 21* 22 23 22 22  21*  GLUCOSE 103* 110* 190* 139* 112* 88 91 101* 133*  BUN 43* 37* 39* 37* 32* 27* 26* 30* 30*  CREATININE 2.48* 2.30* 2.27* 2.16* 1.98* 1.73* 1.89* 2.11* 1.95*  ALBUMIN 3.6  --  3.3*  --  3.1* 2.8* 2.7* 2.9* 2.6*  CALCIUM >15.0* 13.1* 12.3* 12.0* 10.4* 9.7 10.0 9.8 9.5  PHOS 5.1* 4.0 2.5  --   --   --   --   --   --   AST 15  --   --   --  17 14* 13* 13* 11*  ALT 15  --   --   --  13 12 16 18 18    Liver Function Tests: Recent Labs  Lab 08/16/22 0315 08/17/22 0219 08/18/22 0245  AST 13* 13* 11*  ALT 16 18 18   ALKPHOS 47 49 54  BILITOT 0.8 0.6 0.3  PROT 5.5* 5.7* 5.1*  ALBUMIN 2.7* 2.9* 2.6*   Recent Labs  Lab 08/12/22 2021  LIPASE 28   No results for input(s): "AMMONIA" in the last 168 hours. CBC: Recent Labs  Lab 08/14/22 0630 08/15/22 0536 08/16/22 0315 08/17/22 0219 08/18/22 0245  WBC 9.2 7.7 8.0 9.5 9.0  NEUTROABS 7.2 4.4 4.1 4.4 4.4  HGB 9.4* 9.0*  8.9* 8.9* 8.3*  HCT 27.7* 27.3* 27.7* 26.6* 24.9*  MCV 85.2 85.8 87.4 85.3 86.2  PLT 313 279 282 303 270   Cardiac Enzymes: No results for input(s): "CKTOTAL", "CKMB", "CKMBINDEX", "TROPONINI" in the last 168 hours. CBG: Recent Labs  Lab 08/17/22 0801 08/17/22 1235 08/17/22 1557 08/17/22 2121 08/18/22 0621  GLUCAP 110* 131* 110* 110* 121*    Iron Studies: No results for input(s): "IRON", "TIBC", "TRANSFERRIN", "FERRITIN" in the last 72 hours. Studies/Results: No results found.  aspirin EC  81 mg Oral Daily   atorvastatin  80 mg Oral QHS   docusate sodium  200 mg Oral BID   enoxaparin (LOVENOX) injection  40 mg Subcutaneous Q24H   ezetimibe  10 mg Oral Daily   ferrous sulfate  325 mg Oral Daily   fluticasone furoate-vilanterol  1 puff Inhalation Daily   insulin aspart  0-6 Units Subcutaneous TID WC   metoprolol tartrate  25 mg Oral BID   polyethylene glycol  17 g Oral  BID   senna-docusate  1 tablet Oral BID   ticagrelor  90 mg Oral BID    BMET    Component Value Date/Time   NA 141 08/18/2022 0245   K 3.9 08/18/2022 0245   CL 111 08/18/2022 0245   CO2 21 (L) 08/18/2022 0245   GLUCOSE 133 (H) 08/18/2022 0245   BUN 30 (H) 08/18/2022 0245   CREATININE 1.95 (H) 08/18/2022 0245   CALCIUM 9.5 08/18/2022 0245   GFRNONAA 29 (L) 08/18/2022 0245   GFRAA >90 02/19/2011 0234   CBC    Component Value Date/Time   WBC 9.0 08/18/2022 0245   RBC 2.89 (L) 08/18/2022 0245   HGB 8.3 (L) 08/18/2022 0245   HCT 24.9 (L) 08/18/2022 0245   PLT 270 08/18/2022 0245   MCV 86.2 08/18/2022 0245   MCH 28.7 08/18/2022 0245   MCHC 33.3 08/18/2022 0245   RDW 13.9 08/18/2022 0245   LYMPHSABS 3.5 08/18/2022 0245   MONOABS 0.5 08/18/2022 0245   EOSABS 0.5 08/18/2022 0245   BASOSABS 0.0 08/18/2022 0245     Assessment/Plan:  AKI- in setting of hypercalcemia, volume depletion, and likely multiple myeloma.  Scr has continued to improve with IVF's.  UPEP + for bence jones protein and M  spike, lambda and kappa noted.  Continue with IVF's and chemotherapy per Hematology.  Nothing further to add.  Will sign off.  Please call with any questions or concerns. Hypercalcemia with lytic bone lesions, anemia, and renal injury - Per Oncology, very high suspicion for multiple myeloma.  Bone marrow biopsy is pending.  Plan for transfer to [facility] to start chemotherapy. HTN - had some hypotension early and now off of amlodipine and receiving IVF's. H/o stroke - has expressive aphasia and right sided weakness. Anemia of malignancy - per Hematology/Oncology Hyperuricemia - received rasburicase on 08/16/22 and uric acid level dropped to < 1.5. Disposition - transfer to [facility] and if Scr does not return to normal, she can follow up with our office after she completes her chemotherapy.  Irena Cords, MD [employer] [phone]

## 2022-08-18 NOTE — Progress Notes (Signed)
Rachael Lee   DOB:[birth year]   ZO#:[id number]    ASSESSMENT & PLAN:  Myeloma until proven otherwise The constellation of anemia, hypercalcemia, lytic lesions and renal failure, along with very high light chain levels are diagnostic of Multiple myeloma Bone marrow biopsy is pending I discussed with patient, family and primary service She can get started with chemotherapy now that she is transferred from [facility] Her son would like to wait until final bone marrow biopsy report is available before she gets started on treatment We discussed briefly different treatment options Given her young age, I think she can tolerate combination treatment I think combination of dexamethasone, Cytoxan and Velcade would be a good option for her but her son would like to avoid Cytoxan for now due to risk of anemia and transfusion support I will start her on pulsed dexamethasone tomorrow once a week As soon as we have the bone marrow biopsy report back, we will get her started on Velcade I will meet with the son again tomorrow around 3:00 to discuss test results   Hypercalcemia This is improving We can start her on pulse steroids starting tomorrow   Acute renal failure Appreciate nephrology consult, improving   Hyperuricemia Improved with rasburicase  Low vitamin D We will start her on vitamin D supplement high-dose once a week   Anemia Likely due to renal failure and bone marrow infiltration I will order B12 and iron levels tomorrow to exclude nutritional deficiency   Expressive aphasia Due to stroke Due to inability to consent in a normal fashion, I told the son they would need to sign consent for chemotherapy for her   other constipation Needs aggressive laxatives   Discharge planning I anticipate she will be here until the end of the week   All questions were answered. The patient knows to call the clinic with any problems, questions or concerns.   The total time spent in the  appointment was 60 minutes encounter with patients including review of chart and various tests results, discussions about plan of care and coordination of care plan  Artis Delay, MD 08/18/2022 4:16 PM  Subjective:  The patient is transferred to [facility] to start chemotherapy I met with the son and the patient today Her son had multiple questions related to side effects of treatment He is reluctant to consent for her to start treatment without bone marrow biopsy report In the meantime, the patient is relatively asymptomatic  Objective:  Vitals:   08/18/22 1205 08/18/22 1553  BP: 125/60 (!) 153/83  Pulse: 72 71  Resp: 15 18  Temp:  98.2 F (36.8 C)  SpO2: 98% 99%     Intake/Output Summary (Last 24 hours) at 08/18/2022 1616 Last data filed at 08/18/2022 1400 Gross per 24 hour  Intake 462.45 ml  Output 1400 ml  Net -937.55 ml    GENERAL:alert, no distress and comfortable NEURO: alert   Labs:  Recent Labs    08/16/22 0315 08/17/22 0219 08/18/22 0245  NA 138 137 141  K 3.9 4.1 3.9  CL 109 109 111  CO2 22 22 21*  GLUCOSE 91 101* 133*  BUN 26* 30* 30*  CREATININE 1.89* 2.11* 1.95*  CALCIUM 10.0 9.8 9.5  GFRNONAA 30* 27* 29*  PROT 5.5* 5.7* 5.1*  ALBUMIN 2.7* 2.9* 2.6*  AST 13* 13* 11*  ALT 16 18 18   ALKPHOS 47 49 54  BILITOT 0.8 0.6 0.3    Studies:  DG Bone Survey Met  Result Date:  08/15/2022 CLINICAL DATA:  Multiple myeloma. EXAM: METASTATIC BONE SURVEY COMPARISON:  CT scan of August 12, 2022. Radiographs of June 20, 2011. FINDINGS: Several small lucencies are noted in the skull. Several small lucencies are noted in the distal portion of the right radius and ulna. Multilevel degenerative changes are noted in lower cervical spine. Lucency is seen involving the medial portion of left acetabulum which corresponds to the lesion seen on CT scan. Multiple other smaller lucencies are seen throughout the visualized pelvis. IMPRESSION: Multiple lucencies are noted throughout  the visualized pelvis, with the largest noted in the medial portion of the left acetabulum as seen on prior CT scan. Also noted are several small lucencies in the distal portion of the right radius and ulna. Several small lucencies are also noted in the skull. These are consistent with history of multiple myeloma. Electronically Signed   By: Lupita Raider M.D.   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 CT-guided bone lesion biopsy of the left ilium 2. CT-guided bone marrow aspiration and core biopsy MEDICATIONS: None. ANESTHESIA/SEDATION: Moderate (conscious) sedation was employed during this procedure. A total of Versed 1 mg and Fentanyl 50 mcg was administered intravenously. Moderate Sedation Time: 19 minutes. The patient's level of consciousness and vital signs were monitored continuously by radiology nursing throughout the procedure under my direct supervision. FLUOROSCOPY TIME:  N/a COMPLICATIONS: None immediate. PROCEDURE: Informed written consent was obtained from the patient after a thorough discussion of the procedural risks, benefits and alternatives. All questions were addressed. Maximal Sterile Barrier Technique was utilized including caps, mask, sterile gowns, sterile gloves, sterile drape, hand hygiene and skin antiseptic. A timeout was performed prior to the initiation of the procedure. The patient was placed prone on the exam table. Limited CT of the pelvis was performed for planning purposes. This demonstrated lytic lesion in the posterior left ilium. Skin entry site was marked, and the overlying skin was prepped and draped in the standard sterile fashion. Local analgesia was obtained with 1% lidocaine. Using intermittent CT fluoroscopy, a 11 gauge bone biopsy needle was advanced towards the identified lesion and biopsy performed. Specimens were submitted in formalin to  pathology for further handling. Attention was then turned to the contralateral side for bone marrow aspiration and core biopsy. Limited CT of the pelvis was performed for planning purposes. Skin entry site was marked, and the overlying skin was prepped and draped in the standard sterile fashion. Local analgesia was obtained with 1% lidocaine. Using CT guidance, an 11 gauge needle was advanced just deep to the cortex of the right posterior ilium. Subsequently, bone marrow aspiration and core biopsy were performed. Specimens were submitted to lab/pathology for handling. Hemostasis was achieved with manual pressure, and a clean dressing was placed. The patient tolerated the procedure well without immediate complication. IMPRESSION: 1. Successful CT-guided focal bone core needle biopsy of lytic lesion left posterior ilium. 2. Successful CT-guided bone marrow aspiration and core biopsy of the right posterior ilium. Electronically Signed   By: Olive Bass M.D.   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 CT-guided bone lesion biopsy of the left ilium 2. CT-guided bone marrow aspiration and core biopsy MEDICATIONS: None. ANESTHESIA/SEDATION: Moderate (conscious) sedation was employed during this procedure. A total of Versed 1 mg  and Fentanyl 50 mcg was administered intravenously. Moderate Sedation Time: 19 minutes. The patient's level of consciousness and vital signs were monitored continuously by radiology nursing throughout the procedure under my direct supervision. FLUOROSCOPY TIME:  N/a COMPLICATIONS: None immediate. PROCEDURE: Informed written consent was obtained from the patient after a thorough discussion of the procedural risks, benefits and alternatives. All questions were addressed. Maximal Sterile Barrier Technique was utilized including caps, mask, sterile gowns, sterile  gloves, sterile drape, hand hygiene and skin antiseptic. A timeout was performed prior to the initiation of the procedure. The patient was placed prone on the exam table. Limited CT of the pelvis was performed for planning purposes. This demonstrated lytic lesion in the posterior left ilium. Skin entry site was marked, and the overlying skin was prepped and draped in the standard sterile fashion. Local analgesia was obtained with 1% lidocaine. Using intermittent CT fluoroscopy, a 11 gauge bone biopsy needle was advanced towards the identified lesion and biopsy performed. Specimens were submitted in formalin to pathology for further handling. Attention was then turned to the contralateral side for bone marrow aspiration and core biopsy. Limited CT of the pelvis was performed for planning purposes. Skin entry site was marked, and the overlying skin was prepped and draped in the standard sterile fashion. Local analgesia was obtained with 1% lidocaine. Using CT guidance, an 11 gauge needle was advanced just deep to the cortex of the right posterior ilium. Subsequently, bone marrow aspiration and core biopsy were performed. Specimens were submitted to lab/pathology for handling. Hemostasis was achieved with manual pressure, and a clean dressing was placed. The patient tolerated the procedure well without immediate complication. IMPRESSION: 1. Successful CT-guided focal bone core needle biopsy of lytic lesion left posterior ilium. 2. Successful CT-guided bone marrow aspiration and core biopsy of the right posterior ilium. Electronically Signed   By: Olive Bass M.D.   On: 08/14/2022 13:31   DG Abd Portable 1V  Result Date: 08/14/2022 CLINICAL DATA:  Constipation EXAM: PORTABLE ABDOMEN - 1 VIEW COMPARISON:  None Available. FINDINGS: Scattered stool throughout colon, grossly normal stool burden. Small amount gas and stool in rectum. No bowel dilatation or bowel wall thickening. Bones demineralized. IMPRESSION:  Nonobstructive bowel gas pattern. Electronically Signed   By: Ulyses Southward M.D.   On: 08/14/2022 12:27   CT Head Wo Contrast  Result Date: 08/12/2022 CLINICAL DATA:  Initial evaluation for mental status change. EXAM: CT HEAD WITHOUT CONTRAST TECHNIQUE: Contiguous axial images were obtained from the base of the skull through the vertex without intravenous contrast. RADIATION DOSE REDUCTION: This exam was performed according to the departmental dose-optimization program which includes automated exposure control, adjustment of the mA and/or kV according to patient size and/or use of iterative reconstruction technique. COMPARISON:  None Available. FINDINGS: Brain: Large area of encephalomalacia involving the left cerebral hemisphere, consistent with a chronic left MCA distribution infarct. Probable superimposed areas of scattered laminar necrosis within this area. Additional small remote right cerebellar infarct. No acute intracranial hemorrhage. No acute large vessel territory infarct. No mass lesion or midline shift. No hydrocephalus or extra-axial fluid collection. Vascular: No abnormal hyperdense vessel. Scattered calcified atherosclerosis present at the skull base. Skull: Scalp soft tissues and calvarium demonstrate no acute finding. Sinuses/Orbits: Globes and orbital soft tissues within normal limits. 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 Additional chronic mucoperiosteal thickening present about the sphenoethmoidal and maxillary sinuses. No mastoid effusion. Other: None. IMPRESSION: 1. No acute  intracranial abnormality. 2. Large chronic left MCA distribution infarct, with additional small remote right cerebellar infarct. 3. 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 Electronically Signed   By: Rise Mu M.D.   On: 08/12/2022 23:10   CT ABDOMEN PELVIS WO CONTRAST  Result Date: 08/12/2022 CLINICAL  DATA:  Abdominal pain, acute, nonlocalized EXAM: CT ABDOMEN AND PELVIS WITHOUT CONTRAST TECHNIQUE: Multidetector CT imaging of the abdomen and pelvis was performed following the standard protocol without IV contrast. RADIATION DOSE REDUCTION: This exam was performed according to the departmental dose-optimization program which includes automated exposure control, adjustment of the mA and/or kV according to patient size and/or use of iterative reconstruction technique. COMPARISON:  None Available. FINDINGS: Lower chest: Moderate coronary artery calcification. Right coronary artery stenting has been performed. Cardiac size within normal limits. No pericardial effusion. Visualized lung bases are clear. Hepatobiliary: No focal liver abnormality is seen. No gallstones, gallbladder wall thickening, or biliary dilatation. Pancreas: Unremarkable Spleen: Unremarkable Adrenals/Urinary Tract: The adrenal glands are unremarkable. The kidneys are normal in position. Moderate asymmetric right renal cortical atrophy. Multiple nonobstructing calculi are seen within the kidneys bilaterally measuring up to 5 mm within the lower pole the left kidney. No hydronephrosis. No ureteral calculi. Multiple simple cortical cysts are seen within the kidneys bilaterally for which no follow-up imaging is recommended. The bladder is unremarkable. Stomach/Bowel: Stomach is within normal limits. Appendix appears normal. No evidence of bowel wall thickening, distention, or inflammatory changes. Vascular/Lymphatic: Aortic atherosclerosis. No enlarged abdominal or pelvic lymph nodes. Reproductive: Dermoid cyst within the right ovary identified demonstrating macroscopic fat measuring 7.2 x 9.4 x 7.7 cm. Uterus and left adnexa are unremarkable. Other: No abdominal wall hernia. Musculoskeletal: Degenerative changes are seen within the lumbar spine. There are superimposed scattered lytic lesions noted throughout the lumbar spine and pelvis, the largest of  which are is seen within the left iliac spine and axiall image # 56/3 measuring 15 mm, suspicious for osseous metastatic disease or myeloma. Pathologic fracture noted involving the lytic lesion bobbing the inferior right pubic ramus. Age indeterminate superior endplate fracture T9 with no significant loss of height. IMPRESSION: 1. No acute intra-abdominal pathology identified. 2. Moderate coronary artery calcification. 3. Moderate asymmetric right renal cortical atrophy. 4. Mild bilateral nonobstructing nephrolithiasis. No urolithiasis. No hydronephrosis. 5. 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 6. Scattered lytic lesions throughout the lumbar spine and pelvis suspicious for osseous metastatic disease or myeloma. Minimally displaced pathologic fracture of the inferior right pubic ramus. 7. Age indeterminate superior endplate fracture of T9 with no significant loss of height. Aortic Atherosclerosis (ICD10-I70.0). Electronically Signed   By: Helyn Numbers M.D.   On: 08/12/2022 23:06

## 2022-08-18 NOTE — Progress Notes (Signed)
Neck right-sided hemiparesis right arm weaker than the right leg, some expressive aphasia which is chronic and at baseline,                                                                   PROGRESS NOTE                                                                                                                                                                                                             Patient Demographics:    Rachael Lee, is a 57 y.o. female, DOB - April 05, 1966, ZOX:[id number]  Outpatient Primary MD for the patient is [name], MD    LOS - 5  Admit date - 08/12/2022    Chief Complaint  Patient presents with   Abdominal Pain       Brief Narrative (HPI from H&P)    o who presented with abdominal pain for about 1 day prior to admission. She has had back pain for about a week and a half since she came to [place] from [place]. She is visiting family. She has not had nausea, vomiting, diarrhea. She denies leg numbness. Over the last day she has been walking less due to pain radiating down her legs. 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 She does not have braces for her contractures. Her son relates that she has been very thirsty recently and has been drinking a lot of water. She has not been confused. In the emergency department her workup was notable for a calcium greater than 15 and acute kidney injury. PCCM was consulted for evaluation and management.   Was admitted by PCCM seen by nephrology, she was transferred to [facility] under my service on 08/14/2022.  Her workup so far is suggestive of multiple myeloma, hypercalcemia has improved, she has been seen by nephrology and oncology, plan is to commence chemotherapy at [facility] from 08/18/2022 for which she will be transferred to that facility per Dr. Bertis Ruddy.   Subjective:   Patient in bed, appears comfortable, denies any headache,  no fever, no chest pain or pressure, no shortness of breath , no abdominal pain. No new focal weakness.   Assessment  & Plan :  Hypercalcemia causing abdominal pain, dehydration and renal failure with lytic spine lesions and back pain -   She has been seen by PCCM and Renal, nephrology managing hypercalcemia being hydrated, received salmon calcitonin, calcium levels much improved, UPEP has M spike and likely has multiple myeloma, continue to monitor calcium levels intermittently they have much improved.  IV fluids being discontinued today on 08/18/2022.  Renal function has improved.  Nephrology is following and will guide treatment for hypercalcemia and renal failure.  Baseline creatinine appears to be close to 1.1.  Lytic spine lesions, compression fracture, back and leg pain. Concern for malignancy.   Bone biopsy done by IR 08/14/22, continue pain control,  SPEP was not conclusive however UPEP highly suspicious for multiple myeloma with M spike, skeletal survey also positive, bone marrow biopsy results are pending kindly follow-up .  Plan has been discussed by oncology team and patient's family.  She will be transferred to [facility] on 08/18/2022 for initiation and maintenance of chemotherapy.    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  PT OT and speech eval.  On DAPT, back on DAPT at home dose along with Zetia and statin.  Dyslipidemia.  Continue statin and Zetia combination.  Hypertension.  Home dose Norvasc and Lopressor.      Condition - Extremely Guarded  Family Communication  :    Dorna Bloom [phone]  on 08/14/22 Lovena Neighbours [phone]  on 08/17/22  Code Status :  Full  Consults  :  PCCM, Renal, IR, Onc  PUD Prophylaxis :    Procedures  :     CT Biopsy 08/14/22 - 1. Successful CT-guided focal bone core needle biopsy of lytic lesion left posterior ilium. 2.  Successful CT-guided bone marrow aspiration and core biopsy of the right posterior ilium  CT Head - 1. No acute intracranial abnormality. 2. Large chronic left MCA distribution infarct, with additional small remote right cerebellar infarct. 3. 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  CT - 1. No acute intra-abdominal pathology identified. 2. Moderate coronary artery calcification. 3. Moderate asymmetric right renal cortical atrophy. 4. Mild bilateral nonobstructing nephrolithiasis. No urolithiasis. No hydronephrosis. 5. 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 6. Scattered lytic lesions throughout the lumbar spine and pelvis suspicious for osseous metastatic disease or myeloma. Minimally displaced pathologic fracture of the inferior right pubic ramus. 7. Age indeterminate superior endplate fracture of T9 with no significant loss of height. Aortic Atherosclerosis       Disposition Plan  :    Status is: Inpatient   DVT Prophylaxis  :    enoxaparin (LOVENOX) injection 40 mg Start: 08/16/22 1400 SCDs Start: 08/13/22 0057    Lab Results  Component Value Date   PLT 270 08/18/2022    Diet :  Diet Order             Diet heart healthy/carb modified Room service appropriate? Yes; Fluid consistency: Thin  Diet effective now                    Inpatient Medications  Scheduled Meds:  aspirin EC  81 mg Oral Daily   atorvastatin  80 mg Oral QHS   docusate sodium  200 mg Oral BID   enoxaparin (LOVENOX) injection  40 mg Subcutaneous Q24H   ezetimibe  10 mg Oral Daily   ferrous sulfate  325 mg Oral  Daily   fluticasone furoate-vilanterol  1 puff Inhalation Daily   insulin aspart  0-6 Units Subcutaneous TID WC   metoprolol tartrate  25 mg Oral BID   polyethylene glycol  17 g Oral BID   senna-docusate  1 tablet Oral BID   ticagrelor  90 mg Oral BID   Continuous Infusions:   PRN Meds:.acetaminophen, hydrALAZINE, melatonin, ondansetron  (ZOFRAN) IV, mouth rinse, oxyCODONE  Antibiotics  :    Anti-infectives (From admission, onward)    None         Objective:   Vitals:   08/17/22 2000 08/17/22 2219 08/18/22 0359 08/18/22 0737  BP: 129/68 116/68 130/60 (!) 148/73  Pulse:   83 70  Resp:   17 17  Temp: 97.9 F (36.6 C) 97.9 F (36.6 C) 98.4 F (36.9 C)   TempSrc: Oral Oral Oral   SpO2:   97% 99%  Weight:      Height:        Wt Readings from Last 3 Encounters:  08/17/22 86.1 kg     Intake/Output Summary (Last 24 hours) at 08/18/2022 0821 Last data filed at 08/18/2022 0300 Gross per 24 hour  Intake 1828.47 ml  Output 900 ml  Net 928.47 ml     Physical Exam  Awake Alert, Chr R arm >> R leg weakness, expressive aphasia [place].AT,PERRAL Supple Neck, No JVD,   Symmetrical Chest wall movement, Good air movement bilaterally, CTAB RRR,No Gallops,Rubs or new Murmurs,  +ve B.Sounds, Abd Soft, No tenderness,   No Cyanosis, Clubbing or edema        Data Review:    Recent Labs  Lab 08/14/22 0630 08/15/22 0536 08/16/22 0315 08/17/22 0219 08/18/22 0245  WBC 9.2 7.7 8.0 9.5 9.0  HGB 9.4* 9.0* 8.9* 8.9* 8.3*  HCT 27.7* 27.3* 27.7* 26.6* 24.9*  PLT 313 279 282 303 270  MCV 85.2 85.8 87.4 85.3 86.2  MCH 28.9 28.3 28.1 28.5 28.7  MCHC 33.9 33.0 32.1 33.5 33.3  RDW 13.6 13.7 13.7 13.6 13.9  LYMPHSABS 1.3 2.7 3.1 4.1* 3.5  MONOABS 0.6 0.6 0.6 0.5 0.5  EOSABS 0.1 0.0 0.1 0.4 0.5  BASOSABS 0.0 0.0 0.0 0.0 0.0    Recent Labs  Lab 08/13/22 0248 08/13/22 1308 08/13/22 1817 08/14/22 0629 08/14/22 1116 08/15/22 0536 08/16/22 0315 08/17/22 0219 08/18/22 0245  NA 138 138   < >  --  139 137 138 137 141  K 4.1 4.3   < >  --  4.0 3.7 3.9 4.1 3.9  CL 107 103   < >  --  108 110 109 109 111  CO2 22 19*   < >  --  22 23 22 22  21*  ANIONGAP 9 16*   < >  --  9 4* 7 6 9   GLUCOSE 110* 190*   < >  --  112* 88 91 101* 133*  BUN 37* 39*   < >  --  32* 27* 26* 30* 30*  CREATININE 2.30* 2.27*   < >  --  1.98*  1.73* 1.89* 2.11* 1.95*  AST  --   --   --   --  17 14* 13* 13* 11*  ALT  --   --   --   --  13 12 16 18 18   ALKPHOS  --   --   --   --  49 46 47 49 54  BILITOT  --   --   --   --  0.6 0.6 0.8 0.6 0.3  ALBUMIN  --  3.3*  --   --  3.1* 2.8* 2.7* 2.9* 2.6*  INR  --   --   --  1.2  --   --   --   --   --   HGBA1C  --  7.4*  --   --   --   --   --   --   --   BNP  --   --   --   --   --  246.3* 84.0 60.6 117.8*  MG 1.8  --   --   --   --  2.1 2.0 1.8 1.8  CALCIUM 13.1* 12.3*   < >  --  10.4* 9.7 10.0 9.8 9.5   < > = values in this interval not displayed.     Recent Labs  Lab 08/13/22 0248 08/13/22 1308 08/13/22 1817 08/14/22 0629 08/14/22 1116 08/15/22 0536 08/16/22 0315 08/17/22 0219 08/18/22 0245  INR  --   --   --  1.2  --   --   --   --   --   HGBA1C  --  7.4*  --   --   --   --   --   --   --   BNP  --   --   --   --   --  246.3* 84.0 60.6 117.8*  MG 1.8  --   --   --   --  2.1 2.0 1.8 1.8  CALCIUM 13.1* 12.3*   < >  --  10.4* 9.7 10.0 9.8 9.5   < > = values in this interval not displayed.     Lab Results  Component Value Date   HGBA1C 7.4 (H) 08/13/2022      Radiology Reports DG Bone Survey Met  Result Date: 08/15/2022 CLINICAL DATA:  Multiple myeloma. EXAM: METASTATIC BONE SURVEY COMPARISON:  CT scan of August 12, 2022. Radiographs of June 20, 2011. FINDINGS: Several small lucencies are noted in the skull. Several small lucencies are noted in the distal portion of the right radius and ulna. Multilevel degenerative changes are noted in lower cervical spine. Lucency is seen involving the medial portion of left acetabulum which corresponds to the lesion seen on CT scan. Multiple other smaller lucencies are seen throughout the visualized pelvis. IMPRESSION: Multiple lucencies are noted throughout the visualized pelvis, with the largest noted in the medial portion of the left acetabulum as seen on prior CT scan. Also noted are several small lucencies in the distal portion of  the right radius and ulna. Several small lucencies are also noted in the skull. These are consistent with history of multiple myeloma. Electronically Signed   By: Lupita Raider M.D.   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 CT-guided bone lesion biopsy of the left ilium 2. CT-guided bone marrow aspiration and core biopsy MEDICATIONS: None. ANESTHESIA/SEDATION: Moderate (conscious) sedation was employed during this procedure. A total of Versed 1 mg and Fentanyl 50 mcg was administered intravenously. Moderate Sedation Time: 19 minutes. The patient's level of consciousness and vital signs were monitored continuously by radiology nursing throughout the procedure under my direct supervision. FLUOROSCOPY TIME:  N/a COMPLICATIONS: None immediate. PROCEDURE: Informed written consent was obtained from the patient after a thorough discussion of the procedural risks, benefits and alternatives. All questions were addressed.  Maximal Sterile Barrier Technique was utilized including caps, mask, sterile gowns, sterile gloves, sterile drape, hand hygiene and skin antiseptic. A timeout was performed prior to the initiation of the procedure. The patient was placed prone on the exam table. Limited CT of the pelvis was performed for planning purposes. This demonstrated lytic lesion in the posterior left ilium. Skin entry site was marked, and the overlying skin was prepped and draped in the standard sterile fashion. Local analgesia was obtained with 1% lidocaine. Using intermittent CT fluoroscopy, a 11 gauge bone biopsy needle was advanced towards the identified lesion and biopsy performed. Specimens were submitted in formalin to pathology for further handling. Attention was then turned to the contralateral side for bone marrow aspiration and core biopsy. Limited CT of the pelvis was performed for planning  purposes. Skin entry site was marked, and the overlying skin was prepped and draped in the standard sterile fashion. Local analgesia was obtained with 1% lidocaine. Using CT guidance, an 11 gauge needle was advanced just deep to the cortex of the right posterior ilium. Subsequently, bone marrow aspiration and core biopsy were performed. Specimens were submitted to lab/pathology for handling. Hemostasis was achieved with manual pressure, and a clean dressing was placed. The patient tolerated the procedure well without immediate complication. IMPRESSION: 1. Successful CT-guided focal bone core needle biopsy of lytic lesion left posterior ilium. 2. Successful CT-guided bone marrow aspiration and core biopsy of the right posterior ilium. Electronically Signed   By: Olive Bass M.D.   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 CT-guided bone lesion biopsy of the left ilium 2. CT-guided bone marrow aspiration and core biopsy MEDICATIONS: None. ANESTHESIA/SEDATION: Moderate (conscious) sedation was employed during this procedure. A total of Versed 1 mg and Fentanyl 50 mcg was administered intravenously. Moderate Sedation Time: 19 minutes. The patient's level of consciousness and vital signs were monitored continuously by radiology nursing throughout the procedure under my direct supervision. FLUOROSCOPY TIME:  N/a COMPLICATIONS: None immediate. PROCEDURE: Informed written consent was obtained from the patient after a thorough discussion of the procedural risks, benefits and alternatives. All questions were addressed. Maximal Sterile Barrier Technique was utilized including caps, mask, sterile gowns, sterile gloves, sterile drape, hand hygiene and skin antiseptic. A timeout was performed prior to the initiation of the procedure. The patient was placed prone on the exam table. Limited CT of  the pelvis was performed for planning purposes. This demonstrated lytic lesion in the posterior left ilium. Skin entry site was marked, and the overlying skin was prepped and draped in the standard sterile fashion. Local analgesia was obtained with 1% lidocaine. Using intermittent CT fluoroscopy, a 11 gauge bone biopsy needle was advanced towards the identified lesion and biopsy performed. Specimens were submitted in formalin to pathology for further handling. Attention was then turned to the contralateral side for bone marrow aspiration and core biopsy. Limited CT of the pelvis was performed for planning purposes. Skin entry site was marked, and the overlying skin was prepped and draped in the standard sterile fashion. Local analgesia was obtained with 1% lidocaine. Using CT guidance, an 11 gauge needle was advanced just deep to the cortex of the right posterior ilium. Subsequently, bone marrow aspiration and core biopsy were performed. Specimens were submitted to lab/pathology for handling. Hemostasis was achieved with manual pressure, and a clean dressing was placed. The patient tolerated the procedure  well without immediate complication. IMPRESSION: 1. Successful CT-guided focal bone core needle biopsy of lytic lesion left posterior ilium. 2. Successful CT-guided bone marrow aspiration and core biopsy of the right posterior ilium. Electronically Signed   By: Olive Bass M.D.   On: 08/14/2022 13:31   DG Abd Portable 1V  Result Date: 08/14/2022 CLINICAL DATA:  Constipation EXAM: PORTABLE ABDOMEN - 1 VIEW COMPARISON:  None Available. FINDINGS: Scattered stool throughout colon, grossly normal stool burden. Small amount gas and stool in rectum. No bowel dilatation or bowel wall thickening. Bones demineralized. IMPRESSION: Nonobstructive bowel gas pattern. Electronically Signed   By: Ulyses Southward M.D.   On: 08/14/2022 12:27      Signature  -   Susa Raring M.D on 08/18/2022 at 8:21 AM   -  To page go to  [email or web address]

## 2022-08-18 NOTE — Progress Notes (Signed)
Physical Therapy Treatment Patient Details Name: Rachael Lee MRN: [id number] DOB: 07-20-1965 Today's Date: 08/18/2022   History of Present Illness Pt is 57 yo female admitted on 08/12/22 with abdominal and back pain.  Pt found to have hypercalcemia with workup suggestive of multiple myeloma and concern for lytic lesions of spine.  Pt transferred from [facility] to [facility] on 08/18/22 for chemo.  Pt with hx including but not limited to L MCA infarct with aphasia and R weakness, DM, HTN    PT Comments    Pt has been transferred to [facility] and was seen for therapy treatment today.  She was able to ambulate 59' with RW and min A but did fatigue easily.  Pt with expressive aphasia/"word salad" and answered yes/no appropriately 75% of the time.   Further treatment limited due to her dinner arrived and was ready to eat.    Pt's son does report she was working on getting power wheel chair and someone called to tell him to tell hospital to write order for power chair - will notify [facility] team.    Recommendations for follow up therapy are one component of a multi-disciplinary discharge planning process, led by the attending physician.  Recommendations may be updated based on patient status, additional functional criteria and insurance authorization.  Follow Up Recommendations       Assistance Recommended at Discharge PRN  Patient can return home with the following A little help with bathing/dressing/bathroom;Assistance with cooking/housework;Assist for transportation;Help with stairs or ramp for entrance;A little help with walking and/or transfers   Equipment Recommendations  Other (comment);BSC/3in1 (Pt asking for power wheel chair (may have been started outpt/family unsure))    Recommendations for Other Services       Precautions / Restrictions Precautions Precautions: Fall     Mobility  Bed Mobility Overal bed mobility: Needs Assistance Bed Mobility: Supine to Sit     Supine to sit: HOB elevated, Min  guard     General bed mobility comments: increased time and effort with HOB elevated and rail but no assist    Transfers Overall transfer level: Needs assistance Equipment used: Rolling walker (2 wheels) Transfers: Sit to/from Stand Sit to Stand: Min assist           General transfer comment: Light min A to steady, pt self assisted R hand onto RW    Ambulation/Gait Ambulation/Gait assistance: Min assist Gait Distance (Feet): 80 Feet Assistive device: Rolling walker (2 wheels) Gait Pattern/deviations: Step-through pattern, Decreased stride length, Trunk flexed Gait velocity: decr     General Gait Details: Slow gait requiring min A to turn RW and min guard for balance; slight decrease in R foot clearance but does have > neutral dorsiflexion; pt did fatigue easily; son reports fatiguing easier and typically does not use RW   Optometrist    Modified Rankin (Stroke Patients Only)       Balance Overall balance assessment: Needs assistance Sitting-balance support: No upper extremity supported, Feet supported Sitting balance-Leahy Scale: Good     Standing balance support: Bilateral upper extremity supported Standing balance-Leahy Scale: Poor Standing balance comment: Steady with RW                            Cognition Arousal/Alertness: Awake/alert Behavior During Therapy: WFL for tasks assessed/performed Overall Cognitive Status: Difficult to assess  General Comments: Pt with "word salad" majority of the time.  Does answer "yes/no" appropriately 75% of the time.        Exercises      General Comments General comments (skin integrity, edema, etc.): HR 80's-90's; son present intially - reports she was working on getting power wheel chair and he received a call from someone (not sure who) telling him to let the hospital know she wants a power chair      Pertinent  Vitals/Pain Pain Assessment Pain Assessment: No/denies pain    Home Living Family/patient expects to be discharged to:: Private residence Living Arrangements: Children                      Prior Function            PT Goals (current goals can now be found in the care plan section) Progress towards PT goals: Progressing toward goals    Frequency    Min 1X/week      PT Plan Current plan remains appropriate    Co-evaluation              AM-PAC PT "6 Clicks" Mobility   Outcome Measure  Help needed turning from your back to your side while in a flat bed without using bedrails?: A Little Help needed moving from lying on your back to sitting on the side of a flat bed without using bedrails?: A Little Help needed moving to and from a bed to a chair (including a wheelchair)?: A Little Help needed standing up from a chair using your arms (e.g., wheelchair or bedside chair)?: A Little Help needed to walk in hospital room?: A Little Help needed climbing 3-5 steps with a railing? : A Lot 6 Click Score: 17    End of Session Equipment Utilized During Treatment: Gait belt Activity Tolerance: Patient tolerated treatment well (Tolerated well but dinner arrived and pt hungry/excited to go eat) Patient left: in bed;with call bell/phone within reach;with bed alarm set;with family/visitor present (sitting EOB) Nurse Communication: Mobility status PT Visit Diagnosis: Muscle weakness (generalized) (M62.81)     Time: 9604-5409 PT Time Calculation (min) (ACUTE ONLY): 18 min  Charges:  $Gait Training: 8-22 mins                     Anise Salvo, PT Acute Rehab [employer] [facility] [phone]    Rayetta Humphrey 08/18/2022, 5:32 PM

## 2022-08-18 NOTE — Progress Notes (Signed)
Patient is going to be transferred with no report given to me.

## 2022-08-18 NOTE — Plan of Care (Signed)
Problem: Education: Goal: Knowledge of General Education information will improve Description: Including pain rating scale, medication(s)/side effects and non-pharmacologic comfort measures 08/18/2022 0125 by Breck Coons, RN Outcome: Progressing 08/18/2022 0121 by Breck Coons, RN Outcome: Progressing   Problem: Health Behavior/Discharge Planning: Goal: Ability to manage health-related needs will improve 08/18/2022 0125 by Breck Coons, RN Outcome: Progressing 08/18/2022 0121 by Breck Coons, RN Outcome: Progressing   Problem: Clinical Measurements: Goal: Ability to maintain clinical measurements within normal limits will improve 08/18/2022 0125 by Breck Coons, RN Outcome: Progressing 08/18/2022 0121 by Breck Coons, RN Outcome: Progressing Goal: Will remain free from infection 08/18/2022 0125 by Breck Coons, RN Outcome: Progressing 08/18/2022 0121 by Breck Coons, RN Outcome: Progressing Goal: Diagnostic test results will improve 08/18/2022 0125 by Breck Coons, RN Outcome: Progressing 08/18/2022 0121 by Breck Coons, RN Outcome: Progressing Goal: Respiratory complications will improve 08/18/2022 0125 by Breck Coons, RN Outcome: Progressing 08/18/2022 0121 by Breck Coons, RN Outcome: Progressing Goal: Cardiovascular complication will be avoided 08/18/2022 0125 by Breck Coons, RN Outcome: Progressing 08/18/2022 0121 by Breck Coons, RN Outcome: Progressing   Problem: Activity: Goal: Risk for activity intolerance will decrease 08/18/2022 0125 by Breck Coons, RN Outcome: Progressing 08/18/2022 0121 by Breck Coons, RN Outcome: Progressing   Problem: Nutrition: Goal: Adequate nutrition will be maintained 08/18/2022 0125 by Breck Coons, RN Outcome: Progressing 08/18/2022 0121 by Breck Coons, RN Outcome: Progressing   Problem:  Coping: Goal: Level of anxiety will decrease 08/18/2022 0125 by Breck Coons, RN Outcome: Progressing 08/18/2022 0121 by Breck Coons, RN Outcome: Progressing   Problem: Elimination: Goal: Will not experience complications related to bowel motility 08/18/2022 0125 by Breck Coons, RN Outcome: Progressing 08/18/2022 0121 by Breck Coons, RN Outcome: Progressing Goal: Will not experience complications related to urinary retention 08/18/2022 0125 by Breck Coons, RN Outcome: Progressing 08/18/2022 0121 by Breck Coons, RN Outcome: Progressing   Problem: Pain Managment: Goal: General experience of comfort will improve 08/18/2022 0125 by Breck Coons, RN Outcome: Progressing 08/18/2022 0121 by Breck Coons, RN Outcome: Progressing   Problem: Safety: Goal: Ability to remain free from injury will improve 08/18/2022 0125 by Breck Coons, RN Outcome: Progressing 08/18/2022 0121 by Breck Coons, RN Outcome: Progressing   Problem: Skin Integrity: Goal: Risk for impaired skin integrity will decrease 08/18/2022 0125 by Breck Coons, RN Outcome: Progressing 08/18/2022 0121 by Breck Coons, RN Outcome: Progressing   Problem: Education: Goal: Ability to describe self-care measures that may prevent or decrease complications (Diabetes Survival Skills Education) will improve 08/18/2022 0125 by Breck Coons, RN Outcome: Progressing 08/18/2022 0121 by Breck Coons, RN Outcome: Progressing Goal: Individualized Educational Video(s) 08/18/2022 0125 by Breck Coons, RN Outcome: Progressing 08/18/2022 0121 by Breck Coons, RN Outcome: Progressing   Problem: Coping: Goal: Ability to adjust to condition or change in health will improve 08/18/2022 0125 by Breck Coons, RN Outcome: Progressing 08/18/2022 0121 by Breck Coons, RN Outcome: Progressing   Problem:  Fluid Volume: Goal: Ability to maintain a balanced intake and output will improve 08/18/2022 0125 by Breck Coons, RN Outcome: Progressing 08/18/2022 0121 by Breck Coons, RN Outcome: Progressing   Problem: Health Behavior/Discharge Planning: Goal: Ability to identify and utilize available resources and services will improve 08/18/2022 0125 by Breck Coons, RN Outcome: Progressing 08/18/2022 0121 by Breck Coons,  RN Outcome: Progressing Goal: Ability to manage health-related needs will improve 08/18/2022 0125 by Breck Coons, RN Outcome: Progressing 08/18/2022 0121 by Breck Coons, RN Outcome: Progressing   Problem: Metabolic: Goal: Ability to maintain appropriate glucose levels will improve 08/18/2022 0125 by Breck Coons, RN Outcome: Progressing 08/18/2022 0121 by Breck Coons, RN Outcome: Progressing   Problem: Nutritional: Goal: Maintenance of adequate nutrition will improve 08/18/2022 0125 by Breck Coons, RN Outcome: Progressing 08/18/2022 0121 by Breck Coons, RN Outcome: Progressing Goal: Progress toward achieving an optimal weight will improve 08/18/2022 0125 by Breck Coons, RN Outcome: Progressing 08/18/2022 0121 by Breck Coons, RN Outcome: Progressing   Problem: Skin Integrity: Goal: Risk for impaired skin integrity will decrease 08/18/2022 0125 by Breck Coons, RN Outcome: Progressing 08/18/2022 0121 by Breck Coons, RN Outcome: Progressing   Problem: Tissue Perfusion: Goal: Adequacy of tissue perfusion will improve 08/18/2022 0125 by Breck Coons, RN Outcome: Progressing 08/18/2022 0121 by Breck Coons, RN Outcome: Progressing

## 2022-08-19 ENCOUNTER — Other Ambulatory Visit: Payer: Self-pay | Admitting: Hematology and Oncology

## 2022-08-19 DIAGNOSIS — M8440XA Pathological fracture, unspecified site, initial encounter for fracture: Secondary | ICD-10-CM | POA: Insufficient documentation

## 2022-08-19 DIAGNOSIS — E538 Deficiency of other specified B group vitamins: Secondary | ICD-10-CM | POA: Insufficient documentation

## 2022-08-19 DIAGNOSIS — C9000 Multiple myeloma not having achieved remission: Secondary | ICD-10-CM

## 2022-08-19 DIAGNOSIS — N179 Acute kidney failure, unspecified: Secondary | ICD-10-CM | POA: Diagnosis not present

## 2022-08-19 LAB — COMPREHENSIVE METABOLIC PANEL
ALT: 17 U/L (ref 0–44)
AST: 9 U/L — ABNORMAL LOW (ref 15–41)
Albumin: 3 g/dL — ABNORMAL LOW (ref 3.5–5.0)
Alkaline Phosphatase: 48 U/L (ref 38–126)
Anion gap: 11 (ref 5–15)
BUN: 27 mg/dL — ABNORMAL HIGH (ref 6–20)
CO2: 20 mmol/L — ABNORMAL LOW (ref 22–32)
Calcium: 9.7 mg/dL (ref 8.9–10.3)
Chloride: 107 mmol/L (ref 98–111)
Creatinine, Ser: 1.82 mg/dL — ABNORMAL HIGH (ref 0.44–1.00)
GFR, Estimated: 32 mL/min — ABNORMAL LOW (ref >60)
Glucose, Bld: 101 mg/dL — ABNORMAL HIGH (ref 70–99)
Potassium: 3.8 mmol/L (ref 3.5–5.1)
Sodium: 138 mmol/L (ref 135–145)
Total Bilirubin: 0.3 mg/dL (ref 0.3–1.2)
Total Protein: 6 g/dL — ABNORMAL LOW (ref 6.5–8.1)

## 2022-08-19 LAB — CBC WITH DIFFERENTIAL/PLATELET
Abs Immature Granulocytes: 0.07 10*3/uL (ref 0.00–0.07)
Basophils Absolute: 0 10*3/uL (ref 0.0–0.1)
Basophils Relative: 0 %
Eosinophils Absolute: 0.5 10*3/uL (ref 0.0–0.5)
Eosinophils Relative: 5 %
HCT: 27.8 % — ABNORMAL LOW (ref 36.0–46.0)
Hemoglobin: 8.6 g/dL — ABNORMAL LOW (ref 12.0–15.0)
Immature Granulocytes: 1 %
Lymphocytes Relative: 36 %
Lymphs Abs: 3.7 10*3/uL (ref 0.7–4.0)
MCH: 28.4 pg (ref 26.0–34.0)
MCHC: 30.9 g/dL (ref 30.0–36.0)
MCV: 91.7 fL (ref 80.0–100.0)
Monocytes Absolute: 0.7 10*3/uL (ref 0.1–1.0)
Monocytes Relative: 7 %
Neutro Abs: 5.3 10*3/uL (ref 1.7–7.7)
Neutrophils Relative %: 51 %
Platelets: 330 10*3/uL (ref 150–400)
RBC: 3.03 MIL/uL — ABNORMAL LOW (ref 3.87–5.11)
RDW: 14.1 % (ref 11.5–15.5)
WBC: 10.3 10*3/uL (ref 4.0–10.5)
nRBC: 0 % (ref 0.0–0.2)

## 2022-08-19 LAB — IRON AND TIBC
Iron: 30 ug/dL (ref 28–170)
Saturation Ratios: 12 % (ref 10.4–31.8)
TIBC: 262 ug/dL (ref 250–450)
UIBC: 232 ug/dL

## 2022-08-19 LAB — GLUCOSE, CAPILLARY
Glucose-Capillary: 96 mg/dL (ref 70–99)
Glucose-Capillary: 122 mg/dL — ABNORMAL HIGH (ref 70–99)
Glucose-Capillary: 241 mg/dL — ABNORMAL HIGH (ref 70–99)
Glucose-Capillary: 346 mg/dL — ABNORMAL HIGH (ref 70–99)
Glucose-Capillary: 297 mg/dL — ABNORMAL HIGH (ref 70–99)

## 2022-08-19 LAB — FERRITIN: Ferritin: 129 ng/mL (ref 11–307)

## 2022-08-19 LAB — VITAMIN B12: Vitamin B-12: 158 pg/mL — ABNORMAL LOW (ref 180–914)

## 2022-08-19 MED ORDER — BORTEZOMIB CHEMO SQ INJECTION 3.5 MG (2.5MG/ML)
1.3000 mg/m2 | Freq: Once | INTRAMUSCULAR | Status: AC
  Administered 2022-08-19: 2.5 mg via SUBCUTANEOUS
  Filled 2022-08-19: qty 1

## 2022-08-19 MED ORDER — LACTATED RINGERS IV SOLN: INTRAVENOUS | Status: DC

## 2022-08-19 MED ORDER — CYANOCOBALAMIN 1000 MCG/ML IJ SOLN
1000.0000 ug | INTRAMUSCULAR | Status: DC
  Administered 2022-08-19: 1000 ug via INTRAMUSCULAR
  Filled 2022-08-19: qty 1

## 2022-08-19 MED ORDER — ACYCLOVIR 400 MG PO TABS
400.0000 mg | ORAL_TABLET | Freq: Every day | ORAL | Status: DC
  Administered 2022-08-19 – 2022-08-21 (×3): 400 mg via ORAL
  Filled 2022-08-19 (×3): qty 1

## 2022-08-19 NOTE — Hospital Course (Addendum)
57 year old woman PMH stroke, lives in [place], visiting son, presenting to the emergency department was found to have anemia, hypercalcemia, lytic lesions and renal failure diagnostic of multiple myeloma.  Underwent biopsy which is pending at this time.  Was transferred to [facility] for initiation of inpatient chemotherapy.  Consultants PCCM Nephrology signed off 4/29 IR Oncology  Procedures CT Biopsy 08/14/22 - 1. Successful CT-guided focal bone core needle biopsy of lytic lesion left posterior ilium. 2. Successful CT-guided bone marrow aspiration and core biopsy of the right posterior ilium.

## 2022-08-19 NOTE — Progress Notes (Signed)
  Progress Note   Patient: Rachael Lee ZOX:[id number] DOB: Jul 15, 1965 DOA: 08/12/2022     6 DOS: the patient was seen and examined on 08/19/2022   Brief hospital course: 57 year old woman PMH stroke, lives in [place], visiting son, presenting to the emergency department was found to have anemia, hypercalcemia, lytic lesions and renal failure diagnostic of multiple myeloma.  Underwent biopsy which is pending at this time.  Was transferred to [facility] for initiation of inpatient chemotherapy.  Consultants PCCM Nephrology signed off 4/29 IR Oncology  Procedures CT Biopsy 08/14/22 - 1. Successful CT-guided focal bone core needle biopsy of lytic lesion left posterior ilium. 2. Successful CT-guided bone marrow aspiration and core biopsy of the right posterior ilium.  Assessment and Plan: Presumed multiple myeloma with constellation of anemia, hypercalcemia, lytic lesions, AKI, very high light chain levels Normocytic anemia   Admitted by critical care and subsequently transferred to hospitalist service.  Seen by nephrology and oncology.   Hypercalcemia was treated and level has now normalized.   Status post bone marrow biopsy and lytic lesion biopsy.   Renal function slowly improving. Transferred to [facility] for initiation of chemotherapy.   AKI In setting of hypercalcemia, volume depletion, and likely multiple myeloma.  Baseline creatinine 1.1 in October 2022 Continue with IV fluids.  Nephrology signed off.  Hyperuricemia  Received rasburicase on 08/16/22 and uric acid level dropped to < 1.5.    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   Continue DAPT   Essential hypertension   Home dose Norvasc and Lopressor.  Minimally displaced pathologic fracture of the inferior right pubic ramus. Age indeterminate superior endplate fracture of T9 with no significant loss of  height.      Subjective:  Feels ok Has expressive aphasia, but understands speech  Physical Exam: Vitals:   08/19/22 0000 08/19/22 0400 08/19/22 0815 08/19/22 1349  BP: (!) 161/61 (!) 191/80  (!) 146/69  Pulse: 73 79  63  Resp: 18 18  18   Temp: 98.7 F (37.1 C) 98.9 F (37.2 C)  98.3 F (36.8 C)  TempSrc: Oral Oral    SpO2: 100% 100%  97%  Weight:   87.7 kg   Height:       Physical Exam Vitals reviewed.  Constitutional:      General: She is not in acute distress.    Appearance: She is not ill-appearing or toxic-appearing.  Cardiovascular:     Rate and Rhythm: Normal rate and regular rhythm.     Heart sounds: No murmur heard. Pulmonary:     Effort: Pulmonary effort is normal. No respiratory distress.     Breath sounds: No wheezing, rhonchi or rales.  Neurological:     Mental Status: She is alert.  Psychiatric:        Mood and Affect: Mood normal.        Behavior: Behavior normal.     Data Reviewed: Creatinine 1.95 > 1.82, baseline unknown but creatinine was 1.1 02/13/2021 CBG stable  Family Communication: son at bedside  Disposition: Status is: Inpatient Remains inpatient appropriate because: inpatient chemotherapy, physical therapy  Planned Discharge Destination:  TBD    Time spent: 35 minutes  Author: Brendia Sacks, MD 08/19/2022 3:41 PM  For on call review [email or web address].

## 2022-08-19 NOTE — Progress Notes (Signed)
Rachael Lee   DOB:[birth year]   WU#:[id number]    ASSESSMENT & PLAN:  Multiple myeloma Bone marrow biopsy confirmed the diagnosis Given her young age, I think she can tolerate combination treatment I think combination of dexamethasone, Cytoxan and Velcade would be a good option for her but her son would like to avoid Cytoxan for now due to risk of anemia and transfusion support I will start her on pulsed dexamethasone once a week We discussed risks and benefits of velcade and dex and she agreed She will get first dose today Monitor for TLS Will start low dose acyclovir daily Hypercalcemia This is improving We can start her on pulse steroids    Acute renal failure Appreciate nephrology consult, improving Monitor for TLS   Hyperuricemia Improved with rasburicase, recheck next 3 days for TLS   Low vitamin D We will start her on vitamin D supplement high-dose once a week   Anemia Likely due to renal failure and bone marrow infiltration B12 is low, will give her B12 injection weekly DC oral iron   Expressive aphasia Due to stroke Due to inability to consent in a normal fashion, I told the son they would need to sign consent for chemotherapy for her    other constipation Needs aggressive laxatives   Discharge planning Hopefully Friday after second dose of Velcade All questions were answered. The patient knows to call the clinic with any problems, questions or concerns.   The total time spent in the appointment was 55 minutes encounter with patients including review of chart and various tests results, discussions about plan of care and coordination of care plan  Artis Delay, MD 08/19/2022 3:20 PM  Subjective:  She felt ok We discussed results of BM result, iron studies, low B12 and overall plan  Objective:  Vitals:   08/19/22 0400 08/19/22 1349  BP: (!) 191/80 (!) 146/69  Pulse: 79 63  Resp: 18 18  Temp: 98.9 F (37.2 C) 98.3 F (36.8 C)  SpO2: 100% 97%      Intake/Output Summary (Last 24 hours) at 08/19/2022 1520 Last data filed at 08/19/2022 1250 Gross per 24 hour  Intake 120 ml  Output 1475 ml  Net -1355 ml    GENERAL:alert, no distress and comfortable NEURO: alert    Labs:  Recent Labs    08/17/22 0219 08/18/22 0245 08/19/22 0617  NA 137 141 138  K 4.1 3.9 3.8  CL 109 111 107  CO2 22 21* 20*  GLUCOSE 101* 133* 101*  BUN 30* 30* 27*  CREATININE 2.11* 1.95* 1.82*  CALCIUM 9.8 9.5 9.7  GFRNONAA 27* 29* 32*  PROT 5.7* 5.1* 6.0*  ALBUMIN 2.9* 2.6* 3.0*  AST 13* 11* 9*  ALT 18 18 17   ALKPHOS 49 54 48  BILITOT 0.6 0.3 0.3    Studies:  DG Bone Survey Met  Result Date: 08/15/2022 CLINICAL DATA:  Multiple myeloma. EXAM: METASTATIC BONE SURVEY COMPARISON:  CT scan of August 12, 2022. Radiographs of June 20, 2011. FINDINGS: Several small lucencies are noted in the skull. Several small lucencies are noted in the distal portion of the right radius and ulna. Multilevel degenerative changes are noted in lower cervical spine. Lucency is seen involving the medial portion of left acetabulum which corresponds to the lesion seen on CT scan. Multiple other smaller lucencies are seen throughout the visualized pelvis. IMPRESSION: Multiple lucencies are noted throughout the visualized pelvis, with the largest noted in the medial portion of the left acetabulum as seen  on prior CT scan. Also noted are several small lucencies in the distal portion of the right radius and ulna. Several small lucencies are also noted in the skull. These are consistent with history of multiple myeloma. Electronically Signed   By: Lupita Raider M.D.   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 CT-guided bone lesion biopsy of the left ilium 2. CT-guided bone marrow aspiration and core biopsy MEDICATIONS: None. ANESTHESIA/SEDATION: Moderate (conscious)  sedation was employed during this procedure. A total of Versed 1 mg and Fentanyl 50 mcg was administered intravenously. Moderate Sedation Time: 19 minutes. The patient's level of consciousness and vital signs were monitored continuously by radiology nursing throughout the procedure under my direct supervision. FLUOROSCOPY TIME:  N/a COMPLICATIONS: None immediate. PROCEDURE: Informed written consent was obtained from the patient after a thorough discussion of the procedural risks, benefits and alternatives. All questions were addressed. Maximal Sterile Barrier Technique was utilized including caps, mask, sterile gowns, sterile gloves, sterile drape, hand hygiene and skin antiseptic. A timeout was performed prior to the initiation of the procedure. The patient was placed prone on the exam table. Limited CT of the pelvis was performed for planning purposes. This demonstrated lytic lesion in the posterior left ilium. Skin entry site was marked, and the overlying skin was prepped and draped in the standard sterile fashion. Local analgesia was obtained with 1% lidocaine. Using intermittent CT fluoroscopy, a 11 gauge bone biopsy needle was advanced towards the identified lesion and biopsy performed. Specimens were submitted in formalin to pathology for further handling. Attention was then turned to the contralateral side for bone marrow aspiration and core biopsy. Limited CT of the pelvis was performed for planning purposes. Skin entry site was marked, and the overlying skin was prepped and draped in the standard sterile fashion. Local analgesia was obtained with 1% lidocaine. Using CT guidance, an 11 gauge needle was advanced just deep to the cortex of the right posterior ilium. Subsequently, bone marrow aspiration and core biopsy were performed. Specimens were submitted to lab/pathology for handling. Hemostasis was achieved with manual pressure, and a clean dressing was placed. The patient tolerated the procedure well  without immediate complication. IMPRESSION: 1. Successful CT-guided focal bone core needle biopsy of lytic lesion left posterior ilium. 2. Successful CT-guided bone marrow aspiration and core biopsy of the right posterior ilium. Electronically Signed   By: Olive Bass M.D.   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 CT-guided bone lesion biopsy of the left ilium 2. CT-guided bone marrow aspiration and core biopsy MEDICATIONS: None. ANESTHESIA/SEDATION: Moderate (conscious) sedation was employed during this procedure. A total of Versed 1 mg and Fentanyl 50 mcg was administered intravenously. Moderate Sedation Time: 19 minutes. The patient's level of consciousness and vital signs were monitored continuously by radiology nursing throughout the procedure under my direct supervision. FLUOROSCOPY TIME:  N/a COMPLICATIONS: None immediate. PROCEDURE: Informed written consent was obtained from the patient after a thorough discussion of the procedural risks, benefits and alternatives. All questions were addressed. Maximal Sterile Barrier Technique was utilized including caps, mask, sterile gowns, sterile gloves, sterile drape, hand hygiene and skin antiseptic. A timeout was performed prior to the initiation of the procedure. The patient was placed prone on the exam table. Limited CT of the  pelvis was performed for planning purposes. This demonstrated lytic lesion in the posterior left ilium. Skin entry site was marked, and the overlying skin was prepped and draped in the standard sterile fashion. Local analgesia was obtained with 1% lidocaine. Using intermittent CT fluoroscopy, a 11 gauge bone biopsy needle was advanced towards the identified lesion and biopsy performed. Specimens were submitted in formalin to pathology for further handling. Attention was then turned to the contralateral  side for bone marrow aspiration and core biopsy. Limited CT of the pelvis was performed for planning purposes. Skin entry site was marked, and the overlying skin was prepped and draped in the standard sterile fashion. Local analgesia was obtained with 1% lidocaine. Using CT guidance, an 11 gauge needle was advanced just deep to the cortex of the right posterior ilium. Subsequently, bone marrow aspiration and core biopsy were performed. Specimens were submitted to lab/pathology for handling. Hemostasis was achieved with manual pressure, and a clean dressing was placed. The patient tolerated the procedure well without immediate complication. IMPRESSION: 1. Successful CT-guided focal bone core needle biopsy of lytic lesion left posterior ilium. 2. Successful CT-guided bone marrow aspiration and core biopsy of the right posterior ilium. Electronically Signed   By: Olive Bass M.D.   On: 08/14/2022 13:31   DG Abd Portable 1V  Result Date: 08/14/2022 CLINICAL DATA:  Constipation EXAM: PORTABLE ABDOMEN - 1 VIEW COMPARISON:  None Available. FINDINGS: Scattered stool throughout colon, grossly normal stool burden. Small amount gas and stool in rectum. No bowel dilatation or bowel wall thickening. Bones demineralized. IMPRESSION: Nonobstructive bowel gas pattern. Electronically Signed   By: Ulyses Southward M.D.   On: 08/14/2022 12:27   CT Head Wo Contrast  Result Date: 08/12/2022 CLINICAL DATA:  Initial evaluation for mental status change. EXAM: CT HEAD WITHOUT CONTRAST TECHNIQUE: Contiguous axial images were obtained from the base of the skull through the vertex without intravenous contrast. RADIATION DOSE REDUCTION: This exam was performed according to the departmental dose-optimization program which includes automated exposure control, adjustment of the mA and/or kV according to patient size and/or use of iterative reconstruction technique. COMPARISON:  None Available. FINDINGS: Brain: Large area of encephalomalacia  involving the left cerebral hemisphere, consistent with a chronic left MCA distribution infarct. Probable superimposed areas of scattered laminar necrosis within this area. Additional small remote right cerebellar infarct. No acute intracranial hemorrhage. No acute large vessel territory infarct. No mass lesion or midline shift. No hydrocephalus or extra-axial fluid collection. Vascular: No abnormal hyperdense vessel. Scattered calcified atherosclerosis present at the skull base. Skull: Scalp soft tissues and calvarium demonstrate no acute finding. Sinuses/Orbits: Globes and orbital soft tissues within normal limits. 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 Additional chronic mucoperiosteal thickening present about the sphenoethmoidal and maxillary sinuses. No mastoid effusion. Other: None. IMPRESSION: 1. No acute intracranial abnormality. 2. Large chronic left MCA distribution infarct, with additional small remote right cerebellar infarct. 3. 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 Electronically Signed   By: Rise Mu M.D.   On: 08/12/2022 23:10   CT ABDOMEN PELVIS WO CONTRAST  Result Date: 08/12/2022 CLINICAL DATA:  Abdominal pain, acute, nonlocalized EXAM: CT ABDOMEN AND PELVIS WITHOUT CONTRAST TECHNIQUE: Multidetector CT imaging of the abdomen and pelvis was performed following the standard protocol without IV contrast. RADIATION DOSE REDUCTION: This exam was performed according to the departmental dose-optimization program which includes automated exposure control, adjustment of the mA and/or  kV according to patient size and/or use of iterative reconstruction technique. COMPARISON:  None Available. FINDINGS: Lower chest: Moderate coronary artery calcification. Right coronary artery stenting has been performed. Cardiac size within normal limits. No pericardial effusion. Visualized lung bases are  clear. Hepatobiliary: No focal liver abnormality is seen. No gallstones, gallbladder wall thickening, or biliary dilatation. Pancreas: Unremarkable Spleen: Unremarkable Adrenals/Urinary Tract: The adrenal glands are unremarkable. The kidneys are normal in position. Moderate asymmetric right renal cortical atrophy. Multiple nonobstructing calculi are seen within the kidneys bilaterally measuring up to 5 mm within the lower pole the left kidney. No hydronephrosis. No ureteral calculi. Multiple simple cortical cysts are seen within the kidneys bilaterally for which no follow-up imaging is recommended. The bladder is unremarkable. Stomach/Bowel: Stomach is within normal limits. Appendix appears normal. No evidence of bowel wall thickening, distention, or inflammatory changes. Vascular/Lymphatic: Aortic atherosclerosis. No enlarged abdominal or pelvic lymph nodes. Reproductive: Dermoid cyst within the right ovary identified demonstrating macroscopic fat measuring 7.2 x 9.4 x 7.7 cm. Uterus and left adnexa are unremarkable. Other: No abdominal wall hernia. Musculoskeletal: Degenerative changes are seen within the lumbar spine. There are superimposed scattered lytic lesions noted throughout the lumbar spine and pelvis, the largest of which are is seen within the left iliac spine and axiall image # 56/3 measuring 15 mm, suspicious for osseous metastatic disease or myeloma. Pathologic fracture noted involving the lytic lesion bobbing the inferior right pubic ramus. Age indeterminate superior endplate fracture T9 with no significant loss of height. IMPRESSION: 1. No acute intra-abdominal pathology identified. 2. Moderate coronary artery calcification. 3. Moderate asymmetric right renal cortical atrophy. 4. Mild bilateral nonobstructing nephrolithiasis. No urolithiasis. No hydronephrosis. 5. 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 6. Scattered lytic lesions throughout the lumbar spine and pelvis suspicious for osseous  metastatic disease or myeloma. Minimally displaced pathologic fracture of the inferior right pubic ramus. 7. Age indeterminate superior endplate fracture of T9 with no significant loss of height. Aortic Atherosclerosis (ICD10-I70.0). Electronically Signed   By: Helyn Numbers M.D.   On: 08/12/2022 23:06

## 2022-08-19 NOTE — Progress Notes (Signed)
Mobility Specialist - Progress Note   08/19/22 1507  Mobility  Activity Ambulated with assistance in hallway;Ambulated with assistance to bathroom  Level of Assistance Standby assist, set-up cues, supervision of patient - no hands on  Assistive Device Front wheel walker  Distance Ambulated (ft) 250 ft  Activity Response Tolerated well  Mobility Referral Yes  $Mobility charge 1 Mobility   Pt received in bed and agreeable to mobility. When returning to room pt requested assistance to bathroom. No complaints during session. Pt to bed after session with all needs met.    [facility]

## 2022-08-20 DIAGNOSIS — E538 Deficiency of other specified B group vitamins: Secondary | ICD-10-CM | POA: Diagnosis not present

## 2022-08-20 DIAGNOSIS — N179 Acute kidney failure, unspecified: Secondary | ICD-10-CM | POA: Diagnosis not present

## 2022-08-20 DIAGNOSIS — C9000 Multiple myeloma not having achieved remission: Secondary | ICD-10-CM | POA: Diagnosis not present

## 2022-08-20 LAB — COMPREHENSIVE METABOLIC PANEL
ALT: 19 U/L (ref 0–44)
AST: 18 U/L (ref 15–41)
Albumin: 3.3 g/dL — ABNORMAL LOW (ref 3.5–5.0)
Alkaline Phosphatase: 49 U/L (ref 38–126)
Anion gap: 10 (ref 5–15)
BUN: 30 mg/dL — ABNORMAL HIGH (ref 6–20)
CO2: 21 mmol/L — ABNORMAL LOW (ref 22–32)
Calcium: 9.2 mg/dL (ref 8.9–10.3)
Chloride: 104 mmol/L (ref 98–111)
Creatinine, Ser: 1.67 mg/dL — ABNORMAL HIGH (ref 0.44–1.00)
GFR, Estimated: 35 mL/min — ABNORMAL LOW (ref >60)
Glucose, Bld: 169 mg/dL — ABNORMAL HIGH (ref 70–99)
Potassium: 4 mmol/L (ref 3.5–5.1)
Sodium: 135 mmol/L (ref 135–145)
Total Bilirubin: 0.4 mg/dL (ref 0.3–1.2)
Total Protein: 6.5 g/dL (ref 6.5–8.1)

## 2022-08-20 LAB — CBC WITH DIFFERENTIAL/PLATELET
Abs Immature Granulocytes: 0.13 10*3/uL — ABNORMAL HIGH (ref 0.00–0.07)
Basophils Absolute: 0 10*3/uL (ref 0.0–0.1)
Basophils Relative: 0 %
Eosinophils Absolute: 0 10*3/uL (ref 0.0–0.5)
Eosinophils Relative: 0 %
HCT: 27.7 % — ABNORMAL LOW (ref 36.0–46.0)
Hemoglobin: 8.9 g/dL — ABNORMAL LOW (ref 12.0–15.0)
Immature Granulocytes: 1 %
Lymphocytes Relative: 11 %
Lymphs Abs: 1 10*3/uL (ref 0.7–4.0)
MCH: 28.7 pg (ref 26.0–34.0)
MCHC: 32.1 g/dL (ref 30.0–36.0)
MCV: 89.4 fL (ref 80.0–100.0)
Monocytes Absolute: 0.2 10*3/uL (ref 0.1–1.0)
Monocytes Relative: 3 %
Neutro Abs: 7.8 10*3/uL — ABNORMAL HIGH (ref 1.7–7.7)
Neutrophils Relative %: 85 %
Platelets: 338 10*3/uL (ref 150–400)
RBC: 3.1 MIL/uL — ABNORMAL LOW (ref 3.87–5.11)
RDW: 13.8 % (ref 11.5–15.5)
WBC: 9.1 10*3/uL (ref 4.0–10.5)
nRBC: 0 % (ref 0.0–0.2)

## 2022-08-20 LAB — URIC ACID: Uric Acid, Serum: 3.1 mg/dL (ref 2.5–7.1)

## 2022-08-20 LAB — GLUCOSE, CAPILLARY
Glucose-Capillary: 167 mg/dL — ABNORMAL HIGH (ref 70–99)
Glucose-Capillary: 213 mg/dL — ABNORMAL HIGH (ref 70–99)
Glucose-Capillary: 172 mg/dL — ABNORMAL HIGH (ref 70–99)
Glucose-Capillary: 203 mg/dL — ABNORMAL HIGH (ref 70–99)

## 2022-08-20 LAB — SURGICAL PATHOLOGY

## 2022-08-20 MED ORDER — AMLODIPINE BESYLATE 10 MG PO TABS
10.0000 mg | ORAL_TABLET | Freq: Every day | ORAL | Status: DC
  Administered 2022-08-20 – 2022-08-21 (×2): 10 mg via ORAL
  Filled 2022-08-20 (×2): qty 1

## 2022-08-20 NOTE — Progress Notes (Signed)
Physical Therapy Treatment Patient Details Name: Rachael Lee MRN: [id number] DOB: 1965-07-15 Today's Date: 08/20/2022   History of Present Illness Pt is 57 yo female admitted on 08/12/22 with abdominal and back pain.  Pt found to have hypercalcemia with workup suggestive of multiple myeloma and concern for lytic lesions of spine.  Pt transferred from [facility] to [facility] on 08/18/22 for chemo.  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 She has met PT goals, no further PT indicated. PT signing off. Mobility team to follow.     Recommendations for follow up therapy are one component of a multi-disciplinary discharge planning process, led by the attending physician.  Recommendations may be updated based on patient status, additional functional criteria and insurance authorization.  Follow Up Recommendations       Assistance Recommended at Discharge    Patient can return home with the following A little help with bathing/dressing/bathroom;Assist for transportation;Help with stairs or ramp for entrance   Equipment Recommendations  BSC/3in1    Recommendations for Other Services       Precautions / Restrictions Precautions Precautions: Fall Restrictions Weight Bearing Restrictions: No     Mobility  Bed Mobility Overal bed mobility: Modified Independent Bed Mobility: Supine to Sit     Supine to sit: Modified independent (Device/Increase time), HOB elevated          Transfers Overall transfer level: Modified independent Equipment used: Rolling walker (2 wheels) Transfers: Sit to/from Stand                  Ambulation/Gait Ambulation/Gait assistance: Modified independent (Device/Increase time) Gait Distance (Feet): 350 Feet Assistive device: Rolling walker (2 wheels) Gait Pattern/deviations: WFL(Within Functional Limits)        General Gait Details: steady, no loss of balance   Stairs             Wheelchair Mobility    Modified Rankin (Stroke Patients Only)       Balance Overall balance assessment: Modified Independent         Standing balance support: Bilateral upper extremity supported, During functional activity, Reliant on assistive device for balance Standing balance-Leahy Scale: Fair                              Cognition Arousal/Alertness: Awake/alert Behavior During Therapy: WFL for tasks assessed/performed Overall Cognitive Status: Difficult to assess                                 General Comments: Pt with "word salad" majority of the time.  Does answer "yes/no" appropriately 75% of the time.        Exercises      General Comments        Pertinent Vitals/Pain Pain Assessment Faces Pain Scale: No hurt    Home Living                          Prior Function            PT Goals (current goals can now be found in the care plan section) Acute Rehab PT Goals Patient Stated Goal: to go home PT Goal Formulation: With  patient Time For Goal Achievement: 08/27/22 Potential to Achieve Goals: Good Progress towards PT goals: Goals met/education completed, patient discharged from PT    Frequency           PT Plan Discharge plan needs to be updated    Co-evaluation              AM-PAC PT "6 Clicks" Mobility   Outcome Measure  Help needed turning from your back to your side while in a flat bed without using bedrails?: None Help needed moving from lying on your back to sitting on the side of a flat bed without using bedrails?: None Help needed moving to and from a bed to a chair (including a wheelchair)?: None Help needed standing up from a chair using your arms (e.g., wheelchair or bedside chair)?: None Help needed to walk in hospital room?: None Help needed climbing 3-5 steps with a railing? : None 6 Click Score:  24    End of Session Equipment Utilized During Treatment: Gait belt Activity Tolerance: Patient tolerated treatment well Patient left: in chair;with call bell/phone within reach Nurse Communication: Mobility status PT Visit Diagnosis: Muscle weakness (generalized) (M62.81)     Time: 1610-9604 PT Time Calculation (min) (ACUTE ONLY): 13 min  Charges:  $Gait Training: 8-22 mins                     Ralene Bathe Kistler PT 08/20/2022  Acute Rehabilitation Services  Office [phone]

## 2022-08-20 NOTE — Progress Notes (Signed)
PROGRESS NOTE    Rachael Lee  ZOX:[id number] DOB: 1965/06/29 DOA: 08/12/2022 PCP: [name], MD   Brief Narrative:  57 year old woman with past medical history of 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 Underwent biopsy. Was transferred to [facility] for initiation of inpatient chemotherapy.   Assessment & Plan:   New diagnosis of multiple myeloma Normocytic anemia -Hide constellation of anemia/hypercalcemia/lytic lesions/AKI with very high light chain levels -Bone marrow biopsy confirmed the diagnosis.  Oncology following and patient has been started on chemotherapy from 08/19/2022 onwards.  Currently on oral Decadron as well along with oral acyclovir as per oncology.  AKI -Baseline creatinine of 1.1 in October 2022.  Treated with IV fluids and creatinine improving.  Creatinine 1.67 today.  Monitor.  Continue IV fluids  Vitamin B12 deficiency -B12 level 158.  Continue weekly intramuscular cyanocobalamin  Hyperuricemia -Received rasburicase on 08/16/22 and uric acid level dropped to < 1.5.   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  Outpatient follow-up with neurology.  Continue statin  Diabetes mellitus type 2 with hyperglycemia -Continue CBGs with SSI.  Essential hypertension -Continue metoprolol  Minimally displaced pathological fracture of inferior right pubic ramus Age intermittent superior endplate fracture of T9 with no significant loss of height -PT eval  Physical deconditioning -PT following   DVT prophylaxis: Lovenox Code Status: Full Family Communication: None at bedside Disposition  Plan: Status is: Inpatient Remains inpatient appropriate because: Of severity of illness    Consultants: Nephrology/IR/oncology  Procedures: As above  Antimicrobials: None   Subjective: Patient seen and examined at bedside.  Has expressive aphasia.  Nods her head to some questions.  No fever, vomiting, seizures reported.  Objective: Vitals:   08/19/22 1349 08/19/22 2206 08/20/22 0526 08/20/22 0703  BP: (!) 146/69 (!) 181/84 (!) 168/80   Pulse: 63 84 80   Resp: 18 18 18    Temp: 98.3 F (36.8 C) 98.1 F (36.7 C) 97.7 F (36.5 C)   TempSrc:  Oral Oral   SpO2: 97% 98% 98%   Weight:    86.3 kg  Height:        Intake/Output Summary (Last 24 hours) at 08/20/2022 1122 Last data filed at 08/20/2022 1112 Gross per 24 hour  Intake 360 ml  Output 1975 ml  Net -1615 ml   Filed Weights   08/18/22 1530 08/19/22 0815 08/20/22 0703  Weight: 84.7 kg 87.7 kg 86.3 kg    Examination:  General exam: Appears calm and comfortable.  Looks chronically ill and deconditioned.  On room air. Respiratory system: Bilateral decreased breath sounds at bases with some scattered crackles Cardiovascular system: S1 & S2 heard, Rate controlled Gastrointestinal system: Abdomen is nondistended, soft and nontender. Normal bowel sounds heard. Extremities: No cyanosis, clubbing; trace lower extremity edema  Central nervous system: Awake; has expressive aphasia; nods her head to some questions.  Right-sided weakness present  skin: No rashes, lesions or ulcers Psychiatry: Flat affect.  Not agitated.    Data Reviewed: I have personally reviewed following labs and imaging studies  CBC: Recent Labs  Lab 08/16/22 0315 08/17/22 0219 08/18/22 0245 08/19/22 0617 08/20/22 0549  WBC 8.0 9.5 9.0 10.3 9.1  NEUTROABS 4.1 4.4 4.4 5.3  7.8*  HGB 8.9* 8.9* 8.3* 8.6* 8.9*  HCT 27.7* 26.6* 24.9* 27.8* 27.7*  MCV 87.4 85.3 86.2 91.7 89.4  PLT 282 303 270 330 338   Basic Metabolic Panel: Recent Labs  Lab  08/13/22 1308 08/13/22 1817 08/15/22 0536 08/16/22 0315 08/17/22 0219 08/18/22 0245 08/19/22 0617 08/20/22 0549  NA 138   < > 137 138 137 141 138 135  K 4.3   < > 3.7 3.9 4.1 3.9 3.8 4.0  CL 103   < > 110 109 109 111 107 104  CO2 19*   < > 23 22 22  21* 20* 21*  GLUCOSE 190*   < > 88 91 101* 133* 101* 169*  BUN 39*   < > 27* 26* 30* 30* 27* 30*  CREATININE 2.27*   < > 1.73* 1.89* 2.11* 1.95* 1.82* 1.67*  CALCIUM 12.3*   < > 9.7 10.0 9.8 9.5 9.7 9.2  MG  --   --  2.1 2.0 1.8 1.8  --   --   PHOS 2.5  --   --   --   --   --   --   --    < > = values in this interval not displayed.   GFR: Estimated Creatinine Clearance: 35.8 mL/min (A) (by C-G formula based on SCr of 1.67 mg/dL (H)). Liver Function Tests: Recent Labs  Lab 08/16/22 0315 08/17/22 0219 08/18/22 0245 08/19/22 0617 08/20/22 0549  AST 13* 13* 11* 9* 18  ALT 16 18 18 17 19   ALKPHOS 47 49 54 48 49  BILITOT 0.8 0.6 0.3 0.3 0.4  PROT 5.5* 5.7* 5.1* 6.0* 6.5  ALBUMIN 2.7* 2.9* 2.6* 3.0* 3.3*   No results for input(s): "LIPASE", "AMYLASE" in the last 168 hours. No results for input(s): "AMMONIA" in the last 168 hours. Coagulation Profile: Recent Labs  Lab 08/14/22 0629  INR 1.2   Cardiac Enzymes: No results for input(s): "CKTOTAL", "CKMB", "CKMBINDEX", "TROPONINI" in the last 168 hours. BNP (last 3 results) No results for input(s): "PROBNP" in the last 8760 hours. HbA1C: No results for input(s): "HGBA1C" in the last 72 hours. CBG: Recent Labs  Lab 08/19/22 1551 08/19/22 1822 08/19/22 2209 08/20/22 0755 08/20/22 1112  GLUCAP 241* 346* 297* 167* 213*   Lipid Profile: No results for input(s): "CHOL", "HDL", "LDLCALC", "TRIG", "CHOLHDL", "LDLDIRECT" in the last 72 hours. Thyroid Function Tests: No results for input(s): "TSH", "T4TOTAL", "FREET4", "T3FREE", "THYROIDAB" in the last 72 hours. Anemia Panel: Recent Labs    08/19/22 0617  VITAMINB12 158*  FERRITIN 129  TIBC 262  IRON 30   Sepsis  Labs: No results for input(s): "PROCALCITON", "LATICACIDVEN" in the last 168 hours.  Recent Results (from the past 240 hour(s))  MRSA Next Gen by PCR, Nasal     Status: None   Collection Time: 08/13/22  3:50 AM   Specimen: Nasal Mucosa; Nasal Swab  Result Value Ref Range Status   MRSA by PCR Next Gen NOT DETECTED NOT DETECTED Final    Comment: (NOTE) The GeneXpert MRSA Assay (FDA approved for NASAL specimens only), is one component of a comprehensive MRSA colonization surveillance program. It is not intended to diagnose MRSA infection nor to guide or monitor treatment for MRSA infections. Test performance is not FDA approved in patients less than 54 years old. Performed at [facility], [address] [place], [place] [address]          Radiology Studies: No results found.  Scheduled Meds:  acyclovir  400 mg Oral Daily   aspirin EC  81 mg Oral Daily   atorvastatin  80 mg Oral QHS   cyanocobalamin  1,000 mcg Intramuscular Weekly   dexamethasone  40 mg Oral Weekly   docusate sodium  200 mg Oral BID   enoxaparin (LOVENOX) injection  40 mg Subcutaneous Q24H   ezetimibe  10 mg Oral Daily   fluticasone furoate-vilanterol  1 puff Inhalation Daily   insulin aspart  0-6 Units Subcutaneous TID WC   metoprolol tartrate  25 mg Oral BID   polyethylene glycol  17 g Oral BID   senna-docusate  1 tablet Oral BID   ticagrelor  90 mg Oral BID   Vitamin D (Ergocalciferol)  50,000 Units Oral Q7 days   Continuous Infusions:  lactated ringers 75 mL/hr at 08/19/22 1811          Glade Lloyd, MD [facility] 08/20/2022, 11:22 AM

## 2022-08-20 NOTE — Progress Notes (Signed)
Inpatient Rehab Admissions Coordinator:   Note pt with no further PT needs at this time.  Not a candidate for CIR.  Will sign off.  If she declines throughout her treatment please contact me and I will reassess.   Estill Dooms, PT, DPT Admissions Coordinator [phone] 08/20/22  4:16 PM

## 2022-08-21 ENCOUNTER — Ambulatory Visit: Payer: PRIVATE HEALTH INSURANCE

## 2022-08-21 ENCOUNTER — Telehealth: Payer: Self-pay

## 2022-08-21 ENCOUNTER — Encounter: Payer: Self-pay | Admitting: Hematology and Oncology

## 2022-08-21 ENCOUNTER — Other Ambulatory Visit (HOSPITAL_COMMUNITY): Payer: Self-pay

## 2022-08-21 ENCOUNTER — Other Ambulatory Visit: Payer: Self-pay | Admitting: Hematology and Oncology

## 2022-08-21 DIAGNOSIS — C9000 Multiple myeloma not having achieved remission: Secondary | ICD-10-CM

## 2022-08-21 DIAGNOSIS — N179 Acute kidney failure, unspecified: Secondary | ICD-10-CM | POA: Diagnosis not present

## 2022-08-21 DIAGNOSIS — E538 Deficiency of other specified B group vitamins: Secondary | ICD-10-CM | POA: Diagnosis not present

## 2022-08-21 LAB — COMPREHENSIVE METABOLIC PANEL
ALT: 18 U/L (ref 0–44)
AST: 15 U/L (ref 15–41)
Albumin: 3.2 g/dL — ABNORMAL LOW (ref 3.5–5.0)
Alkaline Phosphatase: 49 U/L (ref 38–126)
Anion gap: 8 (ref 5–15)
BUN: 32 mg/dL — ABNORMAL HIGH (ref 6–20)
CO2: 26 mmol/L (ref 22–32)
Calcium: 9.4 mg/dL (ref 8.9–10.3)
Chloride: 106 mmol/L (ref 98–111)
Creatinine, Ser: 1.49 mg/dL — ABNORMAL HIGH (ref 0.44–1.00)
GFR, Estimated: 40 mL/min — ABNORMAL LOW (ref >60)
Glucose, Bld: 141 mg/dL — ABNORMAL HIGH (ref 70–99)
Potassium: 4.1 mmol/L (ref 3.5–5.1)
Sodium: 140 mmol/L (ref 135–145)
Total Bilirubin: 0.4 mg/dL (ref 0.3–1.2)
Total Protein: 6.2 g/dL — ABNORMAL LOW (ref 6.5–8.1)

## 2022-08-21 LAB — CBC WITH DIFFERENTIAL/PLATELET
Abs Immature Granulocytes: 0.07 10*3/uL (ref 0.00–0.07)
Basophils Absolute: 0 10*3/uL (ref 0.0–0.1)
Basophils Relative: 0 %
Eosinophils Absolute: 0 10*3/uL (ref 0.0–0.5)
Eosinophils Relative: 0 %
HCT: 27.5 % — ABNORMAL LOW (ref 36.0–46.0)
Hemoglobin: 8.8 g/dL — ABNORMAL LOW (ref 12.0–15.0)
Immature Granulocytes: 1 %
Lymphocytes Relative: 15 %
Lymphs Abs: 1.6 10*3/uL (ref 0.7–4.0)
MCH: 28.2 pg (ref 26.0–34.0)
MCHC: 32 g/dL (ref 30.0–36.0)
MCV: 88.1 fL (ref 80.0–100.0)
Monocytes Absolute: 1 10*3/uL (ref 0.1–1.0)
Monocytes Relative: 9 %
Neutro Abs: 8.4 10*3/uL — ABNORMAL HIGH (ref 1.7–7.7)
Neutrophils Relative %: 75 %
Platelets: 324 10*3/uL (ref 150–400)
RBC: 3.12 MIL/uL — ABNORMAL LOW (ref 3.87–5.11)
RDW: 14.1 % (ref 11.5–15.5)
WBC: 11.1 10*3/uL — ABNORMAL HIGH (ref 4.0–10.5)
nRBC: 0 % (ref 0.0–0.2)

## 2022-08-21 LAB — URIC ACID: Uric Acid, Serum: 3.9 mg/dL (ref 2.5–7.1)

## 2022-08-21 LAB — IGG, IGA, IGM
IgA: 69 mg/dL — ABNORMAL LOW (ref 87–352)
IgG (Immunoglobin G), Serum: 938 mg/dL (ref 586–1602)
IgM (Immunoglobulin M), Srm: 57 mg/dL (ref 26–217)

## 2022-08-21 LAB — GLUCOSE, CAPILLARY
Glucose-Capillary: 134 mg/dL — ABNORMAL HIGH (ref 70–99)
Glucose-Capillary: 135 mg/dL — ABNORMAL HIGH (ref 70–99)

## 2022-08-21 LAB — MAGNESIUM: Magnesium: 2.2 mg/dL (ref 1.7–2.4)

## 2022-08-21 LAB — SURGICAL PATHOLOGY

## 2022-08-21 MED ORDER — VITAMIN B-12 1000 MCG PO TABS
2000.0000 ug | ORAL_TABLET | Freq: Every day | ORAL | 0 refills | Status: AC
  Filled 2022-08-21 (×2): qty 60, 30d supply, fill #0

## 2022-08-21 MED ORDER — DEXAMETHASONE 4 MG PO TABS
40.0000 mg | ORAL_TABLET | ORAL | 0 refills | Status: AC
  Filled 2022-08-21 (×2): qty 40, 28d supply, fill #0

## 2022-08-21 MED ORDER — ACYCLOVIR 400 MG PO TABS
400.0000 mg | ORAL_TABLET | Freq: Every day | ORAL | 0 refills | Status: AC
Start: 2022-08-22 — End: ?
  Filled 2022-08-21 (×2): qty 30, 30d supply, fill #0

## 2022-08-21 MED ORDER — DEXAMETHASONE 20 MG PO TABS
40.0000 mg | ORAL_TABLET | ORAL | 0 refills | Status: DC
  Filled 2022-08-21: qty 4, fill #0
  Filled 2022-08-21: qty 4, 4d supply, fill #0

## 2022-08-21 NOTE — Telephone Encounter (Signed)
Called son and given appt tomorrow for treatment at [facility], arrive at 1:30 pm for 2 pm appt. Given address. He verbalized understanding.

## 2022-08-21 NOTE — TOC Progression Note (Addendum)
Transition of Care [facility]) - Progression Note    Patient Details  Name: Rachael Lee MRN: [id number] Date of Birth: [birth year]  Transition of Care [facility]) CM/SW Contact  Beckie Busing, RN Phone Number: 08/21/2022, 11:24 AM  Clinical Narrative:    [facility] acknowledges consult for patient that has CIR recommendation but CIR states that patient is not appropriate. CM at bedside to discuss SNF with patient. CM can not confirm that patient is alert and oriented and understands due to all answers to questions are yes. CM called son Scherrie Merritts. Son states that mom basically answers yes or no to everything. CM has explained the recommendation to the son. Son gives CM permission to initiate bed search. CM notices that insurance is Medicaid out of state. CM to determine if insurance will cover out of state SNF.   1240 CM has received massage that son now wants to bring patient home. MD has ordered Home health. CM has made MD aware that CM will not be able to set up [facility] due to no payor source for patient with out of states medicaid and patient is not eligible for charity home health. MD made aware. No other TOC needs TOC will sign off.    Expected Discharge Plan: IP Rehab Facility Barriers to Discharge: Continued Medical Work up  Expected Discharge Plan and Services   Discharge Planning Services: CM Consult Post Acute Care Choice: IP Rehab Living arrangements for the past 2 months: Single Family Home                           HH Arranged: NA HH Agency: NA         Social Determinants of Health (SDOH) Interventions SDOH Screenings   Food Insecurity: No Food Insecurity (08/18/2022)  Housing: Low Risk  (08/18/2022)  Transportation Needs: No Transportation Needs (08/13/2022)  Utilities: Not At Risk (08/18/2022)  Tobacco Use: Medium Risk (08/13/2022)    Readmission Risk Interventions    08/13/2022    2:33 PM  Readmission Risk Prevention Plan  Post Dischage Appt Complete  Medication Screening  Complete  Transportation Screening Complete

## 2022-08-21 NOTE — Progress Notes (Signed)
Rachael Lee   DOB:1965/09/18   ZO#:[id number]    ASSESSMENT & PLAN:  Multiple myeloma Bone marrow biopsy confirmed the diagnosis Given her young age, I think she can tolerate combination treatment I think combination of dexamethasone, Cytoxan and Velcade would be a good option for her but her son would like to avoid Cytoxan for now due to risk of anemia and transfusion support She has received pulsed dexamethasone and first dose of Velcade with no signs of tumor lysis syndrome We will schedule the rest of her future follow-up in the outpatient clinic  Hypercalcemia This is improving Observe closely  Acute renal failure Appreciate nephrology consult, improving No further signs of tumor lysis syndrome and her renal function continues to improve   Hyperuricemia Improved with rasburicase, no signs of TLS  Low vitamin D We will start her on vitamin D supplement high-dose once a week   Anemia Likely due to renal failure and bone marrow infiltration B12 is low, will give her B12 injection weekly DC oral iron   Expressive aphasia Due to stroke Due to inability to consent in a normal fashion, I told the son they would need to sign consent for chemotherapy for her    other constipation, resolved Needs aggressive laxatives   Discharge planning She can be discharged today We will set up outpatient follow-up next week  All questions were answered. The patient knows to call the clinic with any problems, questions or concerns.   The total time spent in the appointment was 25 minutes encounter with patients including review of chart and various tests results, discussions about plan of care and coordination of care plan  Artis Delay, MD 08/21/2022 12:37 PM  Subjective:  Her son is not present.  She felt okay and denies side effects from treatment although it is difficult to communicate with her due to expressive aphasia.  I told her that her renal function and calcium level has  improved.  I spoke with [facility] over the phone  Objective:  Vitals:   08/21/22 0548 08/21/22 0833  BP: (!) 151/75   Pulse: 66   Resp: 18   Temp: 97.6 F (36.4 C)   SpO2: 97% 100%     Intake/Output Summary (Last 24 hours) at 08/21/2022 1237 Last data filed at 08/21/2022 1205 Gross per 24 hour  Intake 2445.23 ml  Output 1650 ml  Net 795.23 ml    GENERAL:alert, no distress and comfortable NEURO: alert    Labs:  Recent Labs    08/19/22 0617 08/20/22 0549 08/21/22 0644  NA 138 135 140  K 3.8 4.0 4.1  CL 107 104 106  CO2 20* 21* 26  GLUCOSE 101* 169* 141*  BUN 27* 30* 32*  CREATININE 1.82* 1.67* 1.49*  CALCIUM 9.7 9.2 9.4  GFRNONAA 32* 35* 40*  PROT 6.0* 6.5 6.2*  ALBUMIN 3.0* 3.3* 3.2*  AST 9* 18 15  ALT 17 19 18   ALKPHOS 48 49 49  BILITOT 0.3 0.4 0.4    Studies:  DG Bone Survey Met  Result Date: 08/15/2022 CLINICAL DATA:  Multiple myeloma. EXAM: METASTATIC BONE SURVEY COMPARISON:  CT scan of August 12, 2022. Radiographs of June 20, 2011. FINDINGS: Several small lucencies are noted in the skull. Several small lucencies are noted in the distal portion of the right radius and ulna. Multilevel degenerative changes are noted in lower cervical spine. Lucency is seen involving the medial portion of left acetabulum which corresponds to the lesion seen on CT scan. Multiple other  smaller lucencies are seen throughout the visualized pelvis. IMPRESSION: Multiple lucencies are noted throughout the visualized pelvis, with the largest noted in the medial portion of the left acetabulum as seen on prior CT scan. Also noted are several small lucencies in the distal portion of the right radius and ulna. Several small lucencies are also noted in the skull. These are consistent with history of multiple myeloma. Electronically Signed   By: Lupita Raider M.D.   On: 08/15/2022 11:49   CT BIOPSY  Result Date: 08/14/2022 INDICATION: Lytic bone lesion of the hip, concern for possible multiple  myeloma/plasmacytoma EXAM: 1. CT-guided bone lesion biopsy of the left ilium 2. CT-guided bone marrow aspiration and core biopsy MEDICATIONS: None. ANESTHESIA/SEDATION: Moderate (conscious) sedation was employed during this procedure. A total of Versed 1 mg and Fentanyl 50 mcg was administered intravenously. Moderate Sedation Time: 19 minutes. The patient's level of consciousness and vital signs were monitored continuously by radiology nursing throughout the procedure under my direct supervision. FLUOROSCOPY TIME:  N/a COMPLICATIONS: None immediate. PROCEDURE: Informed written consent was obtained from the patient after a thorough discussion of the procedural risks, benefits and alternatives. All questions were addressed. Maximal Sterile Barrier Technique was utilized including caps, mask, sterile gowns, sterile gloves, sterile drape, hand hygiene and skin antiseptic. A timeout was performed prior to the initiation of the procedure. The patient was placed prone on the exam table. Limited CT of the pelvis was performed for planning purposes. This demonstrated lytic lesion in the posterior left ilium. Skin entry site was marked, and the overlying skin was prepped and draped in the standard sterile fashion. Local analgesia was obtained with 1% lidocaine. Using intermittent CT fluoroscopy, a 11 gauge bone biopsy needle was advanced towards the identified lesion and biopsy performed. Specimens were submitted in formalin to pathology for further handling. Attention was then turned to the contralateral side for bone marrow aspiration and core biopsy. Limited CT of the pelvis was performed for planning purposes. Skin entry site was marked, and the overlying skin was prepped and draped in the standard sterile fashion. Local analgesia was obtained with 1% lidocaine. Using CT guidance, an 11 gauge needle was advanced just deep to the cortex of the right posterior ilium. Subsequently, bone marrow aspiration and core biopsy were  performed. Specimens were submitted to lab/pathology for handling. Hemostasis was achieved with manual pressure, and a clean dressing was placed. The patient tolerated the procedure well without immediate complication. IMPRESSION: 1. Successful CT-guided focal bone core needle biopsy of lytic lesion left posterior ilium. 2. Successful CT-guided bone marrow aspiration and core biopsy of the right posterior ilium. Electronically Signed   By: Olive Bass M.D.   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 CT-guided bone lesion biopsy of the left ilium 2. CT-guided bone marrow aspiration and core biopsy MEDICATIONS: None. ANESTHESIA/SEDATION: Moderate (conscious) sedation was employed during this procedure. A total of Versed 1 mg and Fentanyl 50 mcg was administered intravenously. Moderate Sedation Time: 19 minutes. The patient's level of consciousness and vital signs were monitored continuously by radiology nursing throughout the procedure under my direct supervision. FLUOROSCOPY TIME:  N/a COMPLICATIONS: None immediate. PROCEDURE: Informed written consent was obtained from the patient after a thorough discussion of the procedural risks, benefits and alternatives. All questions were addressed. Maximal Sterile Barrier Technique was utilized including caps, mask, sterile gowns, sterile gloves,  sterile drape, hand hygiene and skin antiseptic. A timeout was performed prior to the initiation of the procedure. The patient was placed prone on the exam table. Limited CT of the pelvis was performed for planning purposes. This demonstrated lytic lesion in the posterior left ilium. Skin entry site was marked, and the overlying skin was prepped and draped in the standard sterile fashion. Local analgesia was obtained with 1% lidocaine. Using intermittent CT fluoroscopy, a 11 gauge bone biopsy needle  was advanced towards the identified lesion and biopsy performed. Specimens were submitted in formalin to pathology for further handling. Attention was then turned to the contralateral side for bone marrow aspiration and core biopsy. Limited CT of the pelvis was performed for planning purposes. Skin entry site was marked, and the overlying skin was prepped and draped in the standard sterile fashion. Local analgesia was obtained with 1% lidocaine. Using CT guidance, an 11 gauge needle was advanced just deep to the cortex of the right posterior ilium. Subsequently, bone marrow aspiration and core biopsy were performed. Specimens were submitted to lab/pathology for handling. Hemostasis was achieved with manual pressure, and a clean dressing was placed. The patient tolerated the procedure well without immediate complication. IMPRESSION: 1. Successful CT-guided focal bone core needle biopsy of lytic lesion left posterior ilium. 2. Successful CT-guided bone marrow aspiration and core biopsy of the right posterior ilium. Electronically Signed   By: Olive Bass M.D.   On: 08/14/2022 13:31   DG Abd Portable 1V  Result Date: 08/14/2022 CLINICAL DATA:  Constipation EXAM: PORTABLE ABDOMEN - 1 VIEW COMPARISON:  None Available. FINDINGS: Scattered stool throughout colon, grossly normal stool burden. Small amount gas and stool in rectum. No bowel dilatation or bowel wall thickening. Bones demineralized. IMPRESSION: Nonobstructive bowel gas pattern. Electronically Signed   By: Ulyses Southward M.D.   On: 08/14/2022 12:27   CT Head Wo Contrast  Result Date: 08/12/2022 CLINICAL DATA:  Initial evaluation for mental status change. EXAM: CT HEAD WITHOUT CONTRAST TECHNIQUE: Contiguous axial images were obtained from the base of the skull through the vertex without intravenous contrast. RADIATION DOSE REDUCTION: This exam was performed according to the departmental dose-optimization program which includes automated exposure control,  adjustment of the mA and/or kV according to patient size and/or use of iterative reconstruction technique. COMPARISON:  None Available. FINDINGS: Brain: Large area of encephalomalacia involving the left cerebral hemisphere, consistent with a chronic left MCA distribution infarct. Probable superimposed areas of scattered laminar necrosis within this area. Additional small remote right cerebellar infarct. No acute intracranial hemorrhage. No acute large vessel territory infarct. No mass lesion or midline shift. No hydrocephalus or extra-axial fluid collection. Vascular: No abnormal hyperdense vessel. Scattered calcified atherosclerosis present at the skull base. Skull: Scalp soft tissues and calvarium demonstrate no acute finding. Sinuses/Orbits: Globes and orbital soft tissues within normal limits. 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 Additional chronic mucoperiosteal thickening present about the sphenoethmoidal and maxillary sinuses. No mastoid effusion. Other: None. IMPRESSION: 1. No acute intracranial abnormality. 2. Large chronic left MCA distribution infarct, with additional small remote right cerebellar infarct. 3. 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 Electronically Signed   By: Rise Mu M.D.   On: 08/12/2022 23:10   CT ABDOMEN PELVIS WO CONTRAST  Result Date: 08/12/2022 CLINICAL DATA:  Abdominal pain, acute, nonlocalized EXAM: CT ABDOMEN AND PELVIS WITHOUT CONTRAST TECHNIQUE: Multidetector CT imaging of the abdomen and pelvis was  performed following the standard protocol without IV contrast. RADIATION DOSE REDUCTION: This exam was performed according to the departmental dose-optimization program which includes automated exposure control, adjustment of the mA and/or kV according to patient size and/or use of iterative reconstruction technique. COMPARISON:  None Available. FINDINGS: Lower  chest: Moderate coronary artery calcification. Right coronary artery stenting has been performed. Cardiac size within normal limits. No pericardial effusion. Visualized lung bases are clear. Hepatobiliary: No focal liver abnormality is seen. No gallstones, gallbladder wall thickening, or biliary dilatation. Pancreas: Unremarkable Spleen: Unremarkable Adrenals/Urinary Tract: The adrenal glands are unremarkable. The kidneys are normal in position. Moderate asymmetric right renal cortical atrophy. Multiple nonobstructing calculi are seen within the kidneys bilaterally measuring up to 5 mm within the lower pole the left kidney. No hydronephrosis. No ureteral calculi. Multiple simple cortical cysts are seen within the kidneys bilaterally for which no follow-up imaging is recommended. The bladder is unremarkable. Stomach/Bowel: Stomach is within normal limits. Appendix appears normal. No evidence of bowel wall thickening, distention, or inflammatory changes. Vascular/Lymphatic: Aortic atherosclerosis. No enlarged abdominal or pelvic lymph nodes. Reproductive: Dermoid cyst within the right ovary identified demonstrating macroscopic fat measuring 7.2 x 9.4 x 7.7 cm. Uterus and left adnexa are unremarkable. Other: No abdominal wall hernia. Musculoskeletal: Degenerative changes are seen within the lumbar spine. There are superimposed scattered lytic lesions noted throughout the lumbar spine and pelvis, the largest of which are is seen within the left iliac spine and axiall image # 56/3 measuring 15 mm, suspicious for osseous metastatic disease or myeloma. Pathologic fracture noted involving the lytic lesion bobbing the inferior right pubic ramus. Age indeterminate superior endplate fracture T9 with no significant loss of height. IMPRESSION: 1. No acute intra-abdominal pathology identified. 2. Moderate coronary artery calcification. 3. Moderate asymmetric right renal cortical atrophy. 4. Mild bilateral nonobstructing  nephrolithiasis. No urolithiasis. No hydronephrosis. 5. 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 6. Scattered lytic lesions throughout the lumbar spine and pelvis suspicious for osseous metastatic disease or myeloma. Minimally displaced pathologic fracture of the inferior right pubic ramus. 7. Age indeterminate superior endplate fracture of T9 with no significant loss of height. Aortic Atherosclerosis (ICD10-I70.0). Electronically Signed   By: Helyn Numbers M.D.   On: 08/12/2022 23:06

## 2022-08-21 NOTE — Progress Notes (Signed)
PROGRESS NOTE    Rachael Lee  WGN:[id number] DOB: Nov 11, 1965 DOA: 08/12/2022 PCP: [name], MD   Brief Narrative:  57 year old woman with past medical history of 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 Underwent biopsy. Was transferred to [facility] for initiation of inpatient chemotherapy.   Assessment & Plan:   New diagnosis of multiple myeloma Normocytic anemia -Had constellation of anemia/hypercalcemia/lytic lesions/AKI with very high light chain levels -Bone marrow biopsy confirmed the diagnosis.  Oncology following and patient has been started on chemotherapy from 08/19/2022 onwards.  Currently on oral Decadron as well along with oral acyclovir as per oncology.  AKI -Baseline creatinine of 1.1 in October 2022.  Treated with IV fluids and creatinine improving.  Creatinine pending today.  Monitor.  Continue IV fluids  Vitamin B12 deficiency -B12 level 158.  Continue weekly intramuscular cyanocobalamin  Hyperuricemia -Received rasburicase on 08/16/22 and uric acid level dropped to < 1.5.   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  Outpatient follow-up with neurology.  Continue statin  Diabetes mellitus type 2 with hyperglycemia -Continue CBGs with SSI.  Essential hypertension -Continue metoprolol  Minimally displaced pathological fracture of inferior right pubic ramus Age intermittent superior endplate fracture of T9 with no significant loss of height -PT eval  Physical deconditioning -PT recommending CIR placement.  CIR thinks that patient is not a CIR candidate.  Consult TOC for possible SNF  placement.  DVT prophylaxis: Lovenox Code Status: Full Family Communication: None at bedside Disposition Plan: Status is: Inpatient Remains inpatient appropriate because: Of severity of illness    Consultants: Nephrology/IR/oncology  Procedures: As above  Antimicrobials: None   Subjective: Patient seen and examined at bedside.  Has expressive aphasia.  Nods her head to some questions.  No agitation, seizures, vomiting or fever reported. Objective: Vitals:   08/20/22 0703 08/20/22 1345 08/20/22 2127 08/21/22 0548  BP:  (!) 170/81 (!) 146/73 (!) 151/75  Pulse:  77 70 66  Resp:  18 18 18   Temp:  97.6 F (36.4 C) 98.2 F (36.8 C) 97.6 F (36.4 C)  TempSrc:  Oral    SpO2:  99% 99% 97%  Weight: 86.3 kg     Height:        Intake/Output Summary (Last 24 hours) at 08/21/2022 0801 Last data filed at 08/21/2022 0746 Gross per 24 hour  Intake 2445.23 ml  Output 2000 ml  Net 445.23 ml    Filed Weights   08/18/22 1530 08/19/22 0815 08/20/22 0703  Weight: 84.7 kg 87.7 kg 86.3 kg    Examination:  General: Currently on room air.  No distress.  Chronically ill and deconditioned looking. ENT/neck: No thyromegaly.  JVD is not elevated  respiratory: Decreased breath sounds at bases bilaterally with some crackles; no wheezing  CVS: S1-S2 heard, rate controlled currently Abdominal: Soft, nontender, slightly distended; no organomegaly, bowel sounds are heard Extremities: Trace lower extremity edema; no cyanosis  CNS: Alert.  Has expressive aphasia.  Nods her head to some questions.  Right-sided weakness present.   Lymph: No obvious lymphadenopathy Skin: No obvious ecchymosis/lesions  psych: Currently not agitated.  Affect is mostly flat. musculoskeletal: No obvious joint swelling/deformity     Data Reviewed: I  have personally reviewed following labs and imaging studies  CBC: Recent Labs  Lab 08/17/22 0219 08/18/22 0245 08/19/22 0617 08/20/22 0549 08/21/22 0644  WBC 9.5  9.0 10.3 9.1 11.1*  NEUTROABS 4.4 4.4 5.3 7.8* 8.4*  HGB 8.9* 8.3* 8.6* 8.9* 8.8*  HCT 26.6* 24.9* 27.8* 27.7* 27.5*  MCV 85.3 86.2 91.7 89.4 88.1  PLT 303 270 330 338 324    Basic Metabolic Panel: Recent Labs  Lab 08/15/22 0536 08/16/22 0315 08/17/22 0219 08/18/22 0245 08/19/22 0617 08/20/22 0549  NA 137 138 137 141 138 135  K 3.7 3.9 4.1 3.9 3.8 4.0  CL 110 109 109 111 107 104  CO2 23 22 22  21* 20* 21*  GLUCOSE 88 91 101* 133* 101* 169*  BUN 27* 26* 30* 30* 27* 30*  CREATININE 1.73* 1.89* 2.11* 1.95* 1.82* 1.67*  CALCIUM 9.7 10.0 9.8 9.5 9.7 9.2  MG 2.1 2.0 1.8 1.8  --   --     GFR: Estimated Creatinine Clearance: 35.8 mL/min (A) (by C-G formula based on SCr of 1.67 mg/dL (H)). Liver Function Tests: Recent Labs  Lab 08/16/22 0315 08/17/22 0219 08/18/22 0245 08/19/22 0617 08/20/22 0549  AST 13* 13* 11* 9* 18  ALT 16 18 18 17 19   ALKPHOS 47 49 54 48 49  BILITOT 0.8 0.6 0.3 0.3 0.4  PROT 5.5* 5.7* 5.1* 6.0* 6.5  ALBUMIN 2.7* 2.9* 2.6* 3.0* 3.3*    No results for input(s): "LIPASE", "AMYLASE" in the last 168 hours. No results for input(s): "AMMONIA" in the last 168 hours. Coagulation Profile: No results for input(s): "INR", "PROTIME" in the last 168 hours.  Cardiac Enzymes: No results for input(s): "CKTOTAL", "CKMB", "CKMBINDEX", "TROPONINI" in the last 168 hours. BNP (last 3 results) No results for input(s): "PROBNP" in the last 8760 hours. HbA1C: No results for input(s): "HGBA1C" in the last 72 hours. CBG: Recent Labs  Lab 08/20/22 0755 08/20/22 1112 08/20/22 1618 08/20/22 2126 08/21/22 0724  GLUCAP 167* 213* 172* 203* 134*    Lipid Profile: No results for input(s): "CHOL", "HDL", "LDLCALC", "TRIG", "CHOLHDL", "LDLDIRECT" in the last 72 hours. Thyroid Function Tests: No results for input(s): "TSH", "T4TOTAL", "FREET4", "T3FREE", "THYROIDAB" in the last 72 hours. Anemia Panel: Recent Labs    08/19/22 0617  VITAMINB12 158*  FERRITIN 129   TIBC 262  IRON 30    Sepsis Labs: No results for input(s): "PROCALCITON", "LATICACIDVEN" in the last 168 hours.  Recent Results (from the past 240 hour(s))  MRSA Next Gen by PCR, Nasal     Status: None   Collection Time: 08/13/22  3:50 AM   Specimen: Nasal Mucosa; Nasal Swab  Result Value Ref Range Status   MRSA by PCR Next Gen NOT DETECTED NOT DETECTED Final    Comment: (NOTE) The GeneXpert MRSA Assay (FDA approved for NASAL specimens only), is one component of a comprehensive MRSA colonization surveillance program. It is not intended to diagnose MRSA infection nor to guide or monitor treatment for MRSA infections. Test performance is not FDA approved in patients less than 32 years old. Performed at [facility], [address] [place], [place] [address]          Radiology Studies: No results found.      Scheduled Meds:  acyclovir  400 mg Oral Daily   amLODipine  10 mg Oral Daily   aspirin EC  81 mg Oral Daily   atorvastatin  80 mg Oral QHS   cyanocobalamin  1,000 mcg  Intramuscular Weekly   dexamethasone  40 mg Oral Weekly   docusate sodium  200 mg Oral BID   enoxaparin (LOVENOX) injection  40 mg Subcutaneous Q24H   ezetimibe  10 mg Oral Daily   fluticasone furoate-vilanterol  1 puff Inhalation Daily   insulin aspart  0-6 Units Subcutaneous TID WC   metoprolol tartrate  25 mg Oral BID   polyethylene glycol  17 g Oral BID   senna-docusate  1 tablet Oral BID   ticagrelor  90 mg Oral BID   Vitamin D (Ergocalciferol)  50,000 Units Oral Q7 days   Continuous Infusions:  lactated ringers 75 mL/hr at 08/21/22 0443          Glade Lloyd, MD [facility] 08/21/2022, 8:01 AM

## 2022-08-21 NOTE — Discharge Summary (Addendum)
Physician Discharge Summary  Rachael Lee ZOX:[id number] DOB: 09/06/1965 DOA: 08/12/2022  PCP: [name], MD  Admit date: 08/12/2022 Discharge date: 08/21/2022  Admitted From: Home Disposition: Home  Recommendations for Outpatient Follow-up:  Follow up with PCP in 1 week with repeat CBC/BMP Outpatient follow-up with oncology tomorrow Follow up in ED if symptoms worsen or new appear   Home Health: Home health PT/OT Equipment/Devices: None  Discharge Condition: Guarded  CODE STATUS: Full Diet recommendation: Heart healthy/carb modified/diet as per SLP recommendations  Brief/Interim Summary: 57 year old woman with past medical history of 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 Underwent biopsy. Was transferred to [facility] for initiation of inpatient chemotherapy.  Pathology confirmed multiple myeloma.  She was started on chemotherapy on 08/19/2022.  Oncology has cleared the patient for discharge with outpatient follow-up tomorrow.  Discharge patient home today.  Discharge Diagnoses:   New diagnosis of multiple myeloma Normocytic anemia -Had constellation of anemia/hypercalcemia/lytic lesions/AKI with very high light chain levels -Bone marrow biopsy confirmed the diagnosis.  Oncology following and patient has been started on chemotherapy from 08/19/2022 onwards.  Currently on oral Decadron as well along with oral acyclovir as per oncology. -Oncology has cleared the patient for discharge with outpatient follow-up tomorrow.  Discharge patient home today.   AKI -Baseline creatinine of 1.1 in October 2022.  Treated with IV fluids and creatinine improving.  Creatinine improving to 1.49 today.  Outpatient follow-up.  Spironolactone remains on hold.   Treated with IV fluids   Vitamin B12 deficiency -B12 level 158.  Treated with intramuscular cyanocobalamin.  Switch to oral on discharge.   Hyperuricemia -Received rasburicase on 08/16/22 and uric acid level dropped to < 1.5.    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  Outpatient follow-up with neurology.  Continue statin   Diabetes mellitus type 2 with hyperglycemia -Carb modified diet.  Outpatient follow-up.  Essential hypertension -Continue metoprolol.  Spironolactone on hold till reevaluation by PCP.   Minimally displaced pathological fracture of inferior right pubic ramus Age intermittent superior endplate fracture of T9 with no significant loss of height -PT evaluation as below.  Physical deconditioning -PT recommending CIR placement.  CIR thinks that patient is not a CIR candidate.  She will need home health PT/OT.  Discharge Instructions  Discharge Instructions     Diet - low sodium heart healthy   Complete by: As directed    Increase activity slowly   Complete by: As directed    No wound care   Complete by: As directed       Allergies as of 08/21/2022   No Known Allergies      Medication List     STOP taking these medications    ferrous sulfate 325 (65 FE) MG tablet   spironolactone 25 MG tablet Commonly known as: ALDACTONE       TAKE these medications    acyclovir 400 MG tablet Commonly known as: ZOVIRAX Take 1 tablet (400 mg total) by mouth daily. Start taking on: Aug 22, 2022   albuterol 108 (90 Base) MCG/ACT inhaler Commonly known as: VENTOLIN HFA Inhale 2 puffs into the lungs every 6 (six) hours as needed for wheezing or shortness of breath.  amLODipine 10 MG tablet Commonly known as: NORVASC Take 10 mg by mouth daily.   aspirin EC 81 MG tablet Take 81 mg by mouth daily.   atorvastatin 80 MG  tablet Commonly known as: LIPITOR Take 80 mg by mouth at bedtime.   Brilinta 90 MG Tabs tablet Generic drug: ticagrelor Take 90 mg by mouth 2 (two) times daily.   budesonide-formoterol 80-4.5 MCG/ACT inhaler Commonly known as: SYMBICORT Inhale 2 puffs into the lungs 2 (two) times daily.   cholecalciferol 1000 units tablet Commonly known as: VITAMIN D Take 1,000 Units by mouth daily.   clotrimazole 1 % cream Commonly known as: LOTRIMIN Apply 1 Application topically 2 (two) times daily.   cyanocobalamin 1000 MCG tablet Commonly known as: VITAMIN B12 Take 2 tablets (2,000 mcg total) by mouth daily.   dexamethasone 4 MG tablet Commonly known as: DECADRON Take 10 tablets (40 mg total) by mouth once a week. Start taking on: Aug 26, 2022   ezetimibe 10 MG tablet Commonly known as: ZETIA Take 10 mg by mouth daily.   metoprolol tartrate 25 MG tablet Commonly known as: LOPRESSOR Take 25 mg by mouth 2 (two) times daily.          Follow-up Information     [name], MD. Schedule an appointment as soon as possible for a visit in 1 week(s).   Specialty: Internal Medicine Why: with repeat cbc/bmp Contact information: [address] [place] [address] [phone]         Artis Delay, MD Follow up.   Specialty: Hematology and Oncology Why: next week Contact information: [address] [place] [address] [phone]                No Known Allergies  Consultations: Nephrology/oncology/IR   Procedures/Studies: DG Bone Survey Met  Result Date: 08/15/2022 CLINICAL DATA:  Multiple myeloma. EXAM: METASTATIC BONE SURVEY COMPARISON:  CT scan of August 12, 2022. Radiographs of June 20, 2011. FINDINGS: Several small lucencies are noted in the skull. Several small lucencies are noted in the distal portion of the right radius and ulna. Multilevel degenerative changes are noted in lower cervical spine. Lucency is seen involving the  medial portion of left acetabulum which corresponds to the lesion seen on CT scan. Multiple other smaller lucencies are seen throughout the visualized pelvis. IMPRESSION: Multiple lucencies are noted throughout the visualized pelvis, with the largest noted in the medial portion of the left acetabulum as seen on prior CT scan. Also noted are several small lucencies in the distal portion of the right radius and ulna. Several small lucencies are also noted in the skull. These are consistent with history of multiple myeloma. Electronically Signed   By: Lupita Raider M.D.   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 CT-guided bone lesion biopsy of the left ilium 2. CT-guided bone marrow aspiration and core biopsy MEDICATIONS: None. ANESTHESIA/SEDATION: Moderate (conscious) sedation was employed during this procedure. A total of Versed 1 mg and Fentanyl 50 mcg was administered intravenously. Moderate Sedation Time: 19 minutes. The patient's level of consciousness and vital signs were monitored continuously by radiology nursing throughout the procedure under my direct supervision. FLUOROSCOPY TIME:  N/a COMPLICATIONS: None immediate. PROCEDURE: Informed written consent was obtained from the patient after a thorough discussion of the procedural risks, benefits and alternatives. All questions were addressed. Maximal Sterile Barrier Technique was utilized  including caps, mask, sterile gowns, sterile gloves, sterile drape, hand hygiene and skin antiseptic. A timeout was performed prior to the initiation of the procedure. The patient was placed prone on the exam table. Limited CT of the pelvis was performed for planning purposes. This demonstrated lytic lesion in the posterior left ilium. Skin entry site was marked, and the overlying skin was prepped and draped in the standard sterile fashion. Local  analgesia was obtained with 1% lidocaine. Using intermittent CT fluoroscopy, a 11 gauge bone biopsy needle was advanced towards the identified lesion and biopsy performed. Specimens were submitted in formalin to pathology for further handling. Attention was then turned to the contralateral side for bone marrow aspiration and core biopsy. Limited CT of the pelvis was performed for planning purposes. Skin entry site was marked, and the overlying skin was prepped and draped in the standard sterile fashion. Local analgesia was obtained with 1% lidocaine. Using CT guidance, an 11 gauge needle was advanced just deep to the cortex of the right posterior ilium. Subsequently, bone marrow aspiration and core biopsy were performed. Specimens were submitted to lab/pathology for handling. Hemostasis was achieved with manual pressure, and a clean dressing was placed. The patient tolerated the procedure well without immediate complication. IMPRESSION: 1. Successful CT-guided focal bone core needle biopsy of lytic lesion left posterior ilium. 2. Successful CT-guided bone marrow aspiration and core biopsy of the right posterior ilium. Electronically Signed   By: Olive Bass M.D.   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 CT-guided bone lesion biopsy of the left ilium 2. CT-guided bone marrow aspiration and core biopsy MEDICATIONS: None. ANESTHESIA/SEDATION: Moderate (conscious) sedation was employed during this procedure. A total of Versed 1 mg and Fentanyl 50 mcg was administered intravenously. Moderate Sedation Time: 19 minutes. The patient's level of consciousness and vital signs were monitored continuously by radiology nursing throughout the procedure under my direct supervision. FLUOROSCOPY TIME:  N/a COMPLICATIONS: None immediate. PROCEDURE: Informed written consent was obtained from the  patient after a thorough discussion of the procedural risks, benefits and alternatives. All questions were addressed. Maximal Sterile Barrier Technique was utilized including caps, mask, sterile gowns, sterile gloves, sterile drape, hand hygiene and skin antiseptic. A timeout was performed prior to the initiation of the procedure. The patient was placed prone on the exam table. Limited CT of the pelvis was performed for planning purposes. This demonstrated lytic lesion in the posterior left ilium. Skin entry site was marked, and the overlying skin was prepped and draped in the standard sterile fashion. Local analgesia was obtained with 1% lidocaine. Using intermittent CT fluoroscopy, a 11 gauge bone biopsy needle was advanced towards the identified lesion and biopsy performed. Specimens were submitted in formalin to pathology for further handling. Attention was then turned to the contralateral side for bone marrow aspiration and core biopsy. Limited CT of the pelvis was performed for planning purposes. Skin entry site was marked, and the overlying skin was prepped and draped in the standard sterile fashion. Local analgesia was obtained with 1% lidocaine. Using CT guidance, an 11 gauge needle was advanced just deep to the cortex of the right posterior ilium. Subsequently, bone marrow aspiration and core biopsy were performed. Specimens were submitted to lab/pathology for handling. Hemostasis was achieved with manual pressure, and a clean dressing was placed. The patient tolerated the procedure well without immediate complication. IMPRESSION: 1.  Successful CT-guided focal bone core needle biopsy of lytic lesion left posterior ilium. 2. Successful CT-guided bone marrow aspiration and core biopsy of the right posterior ilium. Electronically Signed   By: Olive Bass M.D.   On: 08/14/2022 13:31   DG Abd Portable 1V  Result Date: 08/14/2022 CLINICAL DATA:  Constipation EXAM: PORTABLE ABDOMEN - 1 VIEW COMPARISON:   None Available. FINDINGS: Scattered stool throughout colon, grossly normal stool burden. Small amount gas and stool in rectum. No bowel dilatation or bowel wall thickening. Bones demineralized. IMPRESSION: Nonobstructive bowel gas pattern. Electronically Signed   By: Ulyses Southward M.D.   On: 08/14/2022 12:27   CT Head Wo Contrast  Result Date: 08/12/2022 CLINICAL DATA:  Initial evaluation for mental status change. EXAM: CT HEAD WITHOUT CONTRAST TECHNIQUE: Contiguous axial images were obtained from the base of the skull through the vertex without intravenous contrast. RADIATION DOSE REDUCTION: This exam was performed according to the departmental dose-optimization program which includes automated exposure control, adjustment of the mA and/or kV according to patient size and/or use of iterative reconstruction technique. COMPARISON:  None Available. FINDINGS: Brain: Large area of encephalomalacia involving the left cerebral hemisphere, consistent with a chronic left MCA distribution infarct. Probable superimposed areas of scattered laminar necrosis within this area. Additional small remote right cerebellar infarct. No acute intracranial hemorrhage. No acute large vessel territory infarct. No mass lesion or midline shift. No hydrocephalus or extra-axial fluid collection. Vascular: No abnormal hyperdense vessel. Scattered calcified atherosclerosis present at the skull base. Skull: Scalp soft tissues and calvarium demonstrate no acute finding. Sinuses/Orbits: Globes and orbital soft tissues within normal limits. 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 Additional chronic mucoperiosteal thickening present about the sphenoethmoidal and maxillary sinuses. No mastoid effusion. Other: None. IMPRESSION: 1. No acute intracranial abnormality. 2. Large chronic left MCA distribution infarct, with additional small remote right cerebellar infarct. 3. 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 Electronically Signed   By: Rise Mu M.D.   On: 08/12/2022 23:10   CT ABDOMEN PELVIS WO CONTRAST  Result Date: 08/12/2022 CLINICAL DATA:  Abdominal pain, acute, nonlocalized EXAM: CT ABDOMEN AND PELVIS WITHOUT CONTRAST TECHNIQUE: Multidetector CT imaging of the abdomen and pelvis was performed following the standard protocol without IV contrast. RADIATION DOSE REDUCTION: This exam was performed according to the departmental dose-optimization program which includes automated exposure control, adjustment of the mA and/or kV according to patient size and/or use of iterative reconstruction technique. COMPARISON:  None Available. FINDINGS: Lower chest: Moderate coronary artery calcification. Right coronary artery stenting has been performed. Cardiac size within normal limits. No pericardial effusion. Visualized lung bases are clear. Hepatobiliary: No focal liver abnormality is seen. No gallstones, gallbladder wall thickening, or biliary dilatation. Pancreas: Unremarkable Spleen: Unremarkable Adrenals/Urinary Tract: The adrenal glands are unremarkable. The kidneys are normal in position. Moderate asymmetric right renal cortical atrophy. Multiple nonobstructing calculi are seen within the kidneys bilaterally measuring up to 5 mm within the lower pole the left kidney. No hydronephrosis. No ureteral calculi. Multiple simple cortical cysts are seen within the kidneys bilaterally for which no follow-up imaging is recommended. The bladder is unremarkable. Stomach/Bowel: Stomach is within normal limits. Appendix appears normal. No evidence of bowel wall thickening, distention, or inflammatory changes. Vascular/Lymphatic: Aortic atherosclerosis. No enlarged abdominal or pelvic lymph nodes. Reproductive: Dermoid cyst within the right ovary identified demonstrating macroscopic fat measuring 7.2 x 9.4 x 7.7 cm. Uterus and left  adnexa are unremarkable. Other: No  abdominal wall hernia. Musculoskeletal: Degenerative changes are seen within the lumbar spine. There are superimposed scattered lytic lesions noted throughout the lumbar spine and pelvis, the largest of which are is seen within the left iliac spine and axiall image # 56/3 measuring 15 mm, suspicious for osseous metastatic disease or myeloma. Pathologic fracture noted involving the lytic lesion bobbing the inferior right pubic ramus. Age indeterminate superior endplate fracture T9 with no significant loss of height. IMPRESSION: 1. No acute intra-abdominal pathology identified. 2. Moderate coronary artery calcification. 3. Moderate asymmetric right renal cortical atrophy. 4. Mild bilateral nonobstructing nephrolithiasis. No urolithiasis. No hydronephrosis. 5. 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 6. Scattered lytic lesions throughout the lumbar spine and pelvis suspicious for osseous metastatic disease or myeloma. Minimally displaced pathologic fracture of the inferior right pubic ramus. 7. Age indeterminate superior endplate fracture of T9 with no significant loss of height. Aortic Atherosclerosis (ICD10-I70.0). Electronically Signed   By: Helyn Numbers M.D.   On: 08/12/2022 23:06      Subjective: Patient seen and examined at bedside.  Has expressive aphasia.  Nods her head to some questions.  No agitation, seizures, vomiting or fever reported.   Discharge Exam: Vitals:   08/21/22 0548 08/21/22 0833  BP: (!) 151/75   Pulse: 66   Resp: 18   Temp: 97.6 F (36.4 C)   SpO2: 97% 100%    General: Currently on room air.  No distress.  Chronically ill and deconditioned looking. ENT/neck: No thyromegaly.  JVD is not elevated  respiratory: Decreased breath sounds at bases bilaterally with some crackles; no wheezing  CVS: S1-S2 heard, rate controlled currently Abdominal: Soft, nontender, slightly distended; no organomegaly, bowel sounds are heard Extremities: Trace lower extremity edema; no  cyanosis  CNS: Alert.  Has expressive aphasia.  Nods her head to some questions.  Right-sided weakness present.   Lymph: No obvious lymphadenopathy Skin: No obvious ecchymosis/lesions  psych: Currently not agitated.  Affect is mostly flat. musculoskeletal: No obvious joint swelling/deformity    The results of significant diagnostics from this hospitalization (including imaging, microbiology, ancillary and laboratory) are listed below for reference.     Microbiology: Recent Results (from the past 240 hour(s))  MRSA Next Gen by PCR, Nasal     Status: None   Collection Time: 08/13/22  3:50 AM   Specimen: Nasal Mucosa; Nasal Swab  Result Value Ref Range Status   MRSA by PCR Next Gen NOT DETECTED NOT DETECTED Final    Comment: (NOTE) The GeneXpert MRSA Assay (FDA approved for NASAL specimens only), is one component of a comprehensive MRSA colonization surveillance program. It is not intended to diagnose MRSA infection nor to guide or monitor treatment for MRSA infections. Test performance is not FDA approved in patients less than 65 years old. Performed at [facility], [address], [place], [place] [address]      Labs: BNP (last 3 results) Recent Labs    08/16/22 0315 08/17/22 0219 08/18/22 0245  BNP 84.0 60.6 117.8*   Basic Metabolic Panel: Recent Labs  Lab 08/15/22 0536 08/16/22 0315 08/17/22 0219 08/18/22 0245 08/19/22 0617 08/20/22 0549 08/21/22 0644  NA 137 138 137 141 138 135 140  K 3.7 3.9 4.1 3.9 3.8 4.0 4.1  CL 110 109 109 111 107 104 106  CO2 23 22 22  21* 20* 21* 26  GLUCOSE 88 91 101* 133* 101* 169* 141*  BUN 27* 26* 30* 30* 27*  30* 32*  CREATININE 1.73* 1.89* 2.11* 1.95* 1.82* 1.67* 1.49*  CALCIUM 9.7 10.0 9.8 9.5 9.7 9.2 9.4  MG 2.1 2.0 1.8 1.8  --   --  2.2   Liver Function Tests: Recent Labs  Lab 08/17/22 0219 08/18/22 0245 08/19/22 0617 08/20/22 0549 08/21/22 0644  AST 13* 11* 9* 18 15  ALT 18 18 17 19 18   ALKPHOS 49 54 48 49  49  BILITOT 0.6 0.3 0.3 0.4 0.4  PROT 5.7* 5.1* 6.0* 6.5 6.2*  ALBUMIN 2.9* 2.6* 3.0* 3.3* 3.2*   No results for input(s): "LIPASE", "AMYLASE" in the last 168 hours. No results for input(s): "AMMONIA" in the last 168 hours. CBC: Recent Labs  Lab 08/17/22 0219 08/18/22 0245 08/19/22 0617 08/20/22 0549 08/21/22 0644  WBC 9.5 9.0 10.3 9.1 11.1*  NEUTROABS 4.4 4.4 5.3 7.8* 8.4*  HGB 8.9* 8.3* 8.6* 8.9* 8.8*  HCT 26.6* 24.9* 27.8* 27.7* 27.5*  MCV 85.3 86.2 91.7 89.4 88.1  PLT 303 270 330 338 324   Cardiac Enzymes: No results for input(s): "CKTOTAL", "CKMB", "CKMBINDEX", "TROPONINI" in the last 168 hours. BNP: Invalid input(s): "POCBNP" CBG: Recent Labs  Lab 08/20/22 1112 08/20/22 1618 08/20/22 2126 08/21/22 0724 08/21/22 1111  GLUCAP 213* 172* 203* 134* 135*   D-Dimer No results for input(s): "DDIMER" in the last 72 hours. Hgb A1c No results for input(s): "HGBA1C" in the last 72 hours. Lipid Profile No results for input(s): "CHOL", "HDL", "LDLCALC", "TRIG", "CHOLHDL", "LDLDIRECT" in the last 72 hours. Thyroid function studies No results for input(s): "TSH", "T4TOTAL", "T3FREE", "THYROIDAB" in the last 72 hours.  Invalid input(s): "FREET3" Anemia work up Recent Labs    08/19/22 0617  VITAMINB12 158*  FERRITIN 129  TIBC 262  IRON 30   Urinalysis    Component Value Date/Time   COLORURINE YELLOW 08/12/2022 2000   APPEARANCEUR HAZY (A) 08/12/2022 2000   LABSPEC 1.012 08/12/2022 2000   PHURINE 5.0 08/12/2022 2000   GLUCOSEU NEGATIVE 08/12/2022 2000   HGBUR SMALL (A) 08/12/2022 2000   BILIRUBINUR NEGATIVE 08/12/2022 2000   KETONESUR NEGATIVE 08/12/2022 2000   PROTEINUR NEGATIVE 08/12/2022 2000   NITRITE NEGATIVE 08/12/2022 2000   LEUKOCYTESUR NEGATIVE 08/12/2022 2000   Sepsis Labs Recent Labs  Lab 08/18/22 0245 08/19/22 0617 08/20/22 0549 08/21/22 0644  WBC 9.0 10.3 9.1 11.1*   Microbiology Recent Results (from the past 240 hour(s))  MRSA Next Gen  by PCR, Nasal     Status: None   Collection Time: 08/13/22  3:50 AM   Specimen: Nasal Mucosa; Nasal Swab  Result Value Ref Range Status   MRSA by PCR Next Gen NOT DETECTED NOT DETECTED Final    Comment: (NOTE) The GeneXpert MRSA Assay (FDA approved for NASAL specimens only), is one component of a comprehensive MRSA colonization surveillance program. It is not intended to diagnose MRSA infection nor to guide or monitor treatment for MRSA infections. Test performance is not FDA approved in patients less than 47 years old. Performed at [facility], [address], [place], [place] [address]      Time coordinating discharge: 35 minutes  SIGNED:   Glade Lloyd, MD  [facility] 08/21/2022, 12:19 PM

## 2022-08-22 ENCOUNTER — Encounter: Payer: Self-pay | Admitting: Hematology and Oncology

## 2022-08-22 ENCOUNTER — Inpatient Hospital Stay: Payer: Medicaid Other | Attending: Hematology and Oncology

## 2022-08-22 ENCOUNTER — Inpatient Hospital Stay (HOSPITAL_BASED_OUTPATIENT_CLINIC_OR_DEPARTMENT_OTHER): Payer: Medicaid Other | Admitting: Hematology and Oncology

## 2022-08-22 ENCOUNTER — Other Ambulatory Visit: Payer: Self-pay

## 2022-08-22 VITALS — BP 142/68 | HR 65 | Temp 98.3°F | Resp 17

## 2022-08-22 DIAGNOSIS — E790 Hyperuricemia without signs of inflammatory arthritis and tophaceous disease: Secondary | ICD-10-CM | POA: Insufficient documentation

## 2022-08-22 DIAGNOSIS — Z79899 Other long term (current) drug therapy: Secondary | ICD-10-CM | POA: Diagnosis not present

## 2022-08-22 DIAGNOSIS — D539 Nutritional anemia, unspecified: Secondary | ICD-10-CM | POA: Insufficient documentation

## 2022-08-22 DIAGNOSIS — G9389 Other specified disorders of brain: Secondary | ICD-10-CM | POA: Insufficient documentation

## 2022-08-22 DIAGNOSIS — Z7902 Long term (current) use of antithrombotics/antiplatelets: Secondary | ICD-10-CM | POA: Insufficient documentation

## 2022-08-22 DIAGNOSIS — M47812 Spondylosis without myelopathy or radiculopathy, cervical region: Secondary | ICD-10-CM | POA: Insufficient documentation

## 2022-08-22 DIAGNOSIS — C9002 Multiple myeloma in relapse: Secondary | ICD-10-CM | POA: Insufficient documentation

## 2022-08-22 DIAGNOSIS — J3489 Other specified disorders of nose and nasal sinuses: Secondary | ICD-10-CM | POA: Diagnosis not present

## 2022-08-22 DIAGNOSIS — N179 Acute kidney failure, unspecified: Secondary | ICD-10-CM

## 2022-08-22 DIAGNOSIS — E538 Deficiency of other specified B group vitamins: Secondary | ICD-10-CM | POA: Insufficient documentation

## 2022-08-22 DIAGNOSIS — D270 Benign neoplasm of right ovary: Secondary | ICD-10-CM | POA: Insufficient documentation

## 2022-08-22 DIAGNOSIS — I63512 Cerebral infarction due to unspecified occlusion or stenosis of left middle cerebral artery: Secondary | ICD-10-CM | POA: Diagnosis not present

## 2022-08-22 DIAGNOSIS — M47816 Spondylosis without myelopathy or radiculopathy, lumbar region: Secondary | ICD-10-CM | POA: Diagnosis not present

## 2022-08-22 DIAGNOSIS — I700 Atherosclerosis of aorta: Secondary | ICD-10-CM | POA: Insufficient documentation

## 2022-08-22 DIAGNOSIS — E559 Vitamin D deficiency, unspecified: Secondary | ICD-10-CM

## 2022-08-22 DIAGNOSIS — C9000 Multiple myeloma not having achieved remission: Secondary | ICD-10-CM

## 2022-08-22 DIAGNOSIS — J321 Chronic frontal sinusitis: Secondary | ICD-10-CM | POA: Diagnosis not present

## 2022-08-22 DIAGNOSIS — Z8673 Personal history of transient ischemic attack (TIA), and cerebral infarction without residual deficits: Secondary | ICD-10-CM | POA: Diagnosis not present

## 2022-08-22 MED ORDER — BORTEZOMIB CHEMO SQ INJECTION 3.5 MG (2.5MG/ML)
1.3000 mg/m2 | Freq: Once | INTRAMUSCULAR | Status: AC
  Administered 2022-08-22: 2.5 mg via SUBCUTANEOUS
  Filled 2022-08-22: qty 1

## 2022-08-22 NOTE — Progress Notes (Signed)
Per Dr Bertis Ruddy, ok to proceed with labs from yesterday

## 2022-08-22 NOTE — Patient Instructions (Signed)
[facility]  Discharge Instructions: Thank you for choosing [facility] to provide your oncology and hematology care.   If you have a lab appointment with the Cancer Center, please go directly to the Cancer Center and check in at the registration area.   Wear comfortable clothing and clothing appropriate for easy access to any Portacath or PICC line.   We strive to give you quality time with your provider. You may need to reschedule your appointment if you arrive late (15 or more minutes).  Arriving late affects you and other patients whose appointments are after yours.  Also, if you miss three or more appointments without notifying the office, you may be dismissed from the clinic at the provider's discretion.      For prescription refill requests, have your pharmacy contact our office and allow 72 hours for refills to be completed.    Today you received the following chemotherapy and/or immunotherapy agents: Velcade     To help prevent nausea and vomiting after your treatment, we encourage you to take your nausea medication as directed.  BELOW ARE SYMPTOMS THAT SHOULD BE REPORTED IMMEDIATELY: *FEVER GREATER THAN 100.4 F (38 C) OR HIGHER *CHILLS OR SWEATING *NAUSEA AND VOMITING THAT IS NOT CONTROLLED WITH YOUR NAUSEA MEDICATION *UNUSUAL SHORTNESS OF BREATH *UNUSUAL BRUISING OR BLEEDING *URINARY PROBLEMS (pain or burning when urinating, or frequent urination) *BOWEL PROBLEMS (unusual diarrhea, constipation, pain near the anus) TENDERNESS IN MOUTH AND THROAT WITH OR WITHOUT PRESENCE OF ULCERS (sore throat, sores in mouth, or a toothache) UNUSUAL RASH, SWELLING OR PAIN  UNUSUAL VAGINAL DISCHARGE OR ITCHING   Items with * indicate a potential emergency and should be followed up as soon as possible or go to the Emergency Department if any problems should occur.  Please show the CHEMOTHERAPY ALERT CARD or IMMUNOTHERAPY ALERT CARD at check-in  to the Emergency Department and triage nurse.  Should you have questions after your visit or need to cancel or reschedule your appointment, please contact [facility]  Dept: [phone]  and follow the prompts.  Office hours are 8:00 a.m. to 4:30 p.m. Monday - Friday. Please note that voicemails left after 4:00 p.m. may not be returned until the following business day.  We are closed weekends and major holidays. You have access to a nurse at all times for urgent questions. Please call the main number to the clinic Dept: [phone] and follow the prompts.   For any non-urgent questions, you may also contact your provider using MyChart. We now offer e-Visits for anyone 18 and older to request care online for non-urgent symptoms. For details visit [email or web address].   Also download the MyChart app! Go to the app store, search "MyChart", open the app, select [facility], and log in with your MyChart username and password.   

## 2022-08-22 NOTE — Assessment & Plan Note (Signed)
Her renal function has improved recently We will monitor closely

## 2022-08-22 NOTE — Assessment & Plan Note (Addendum)
She will continue high-dose vitamin D replacement therapy

## 2022-08-22 NOTE — Progress Notes (Signed)
[facility] OFFICE PROGRESS NOTE  Patient Care Team: [name], MD as PCP - General (Internal Medicine)  ASSESSMENT & PLAN:  Multiple myeloma without remission (HCC) She tolerated treatment very well without major side effects Serum calcium and creatinine improving Her anemia is stable Per previous discussion, we will continue on single agent Velcade only for cycle 1 of therapy I reminded the patient and family to take weekly dexamethasone We discussed importance of prophylactic antimicrobial with acyclovir She is reminded to drink lots of fluids to avoid worsening renal failure She will also continue high-dose vitamin D supplement Discussed the need for dental clearance before IV bisphosphonates I recommend the patient and her son to consider adding Cytoxan or daratumumab I will review the recommendation again next week   Vitamin D deficiency She will continue high-dose vitamin D replacement therapy   Vitamin B12 deficiency I recommend vitamin B12 injection once a week for 1 month and then monthly  Hyperuricemia She had received rasburicase in the hospital She has no signs of tumor lysis syndrome recently  AKI (acute kidney injury) (HCC) Her renal function has improved recently We will monitor closely  Orders Placed This Encounter  Procedures   Uric acid    Standing Status:   Future    Standing Expiration Date:   08/22/2023   Lactate dehydrogenase    Standing Status:   Future    Standing Expiration Date:   08/22/2023   Pretreatment RBC phenotype    Standing Status:   Future    Standing Expiration Date:   08/22/2023    Order Specific Question:   Medication to be given:    Answer:   Daratumumab    All questions were answered. The patient knows to call the clinic with any problems, questions or concerns. The total time spent in the appointment was 30 minutes encounter with patients including review of chart and various tests results, discussions about  plan of care and coordination of care plan   Artis Delay, MD 08/22/2022 2:31 PM  INTERVAL HISTORY: Please see below for problem oriented charting. she returns for hospital follow-up She is here with her family, returning for cycle 1 day 8 of treatment with Velcade She complained of intermittent stomach discomfort that sounds like gastritis Otherwise she felt fine I reviewed her medication list We discussed the risk and benefits of adding Cytoxan versus daratumumab in the future  REVIEW OF SYSTEMS:   Constitutional: Denies fevers, chills or abnormal weight loss Eyes: Denies blurriness of vision Ears, nose, mouth, throat, and face: Denies mucositis or sore throat Respiratory: Denies cough, dyspnea or wheezes Cardiovascular: Denies palpitation, chest discomfort or lower extremity swelling Gastrointestinal:  Denies nausea, heartburn or change in bowel habits Skin: Denies abnormal skin rashes Lymphatics: Denies new lymphadenopathy or easy bruising Neurological:Denies numbness, tingling or new weaknesses Behavioral/Psych: Mood is stable, no new changes  All other systems were reviewed with the patient and are negative.  I have reviewed the past medical history, past surgical history, social history and family history with the patient and they are unchanged from previous note.  ALLERGIES:  has No Known Allergies.  MEDICATIONS:  Current Outpatient Medications  Medication Sig Dispense Refill   acyclovir (ZOVIRAX) 400 MG tablet Take 1 tablet (400 mg total) by mouth daily. 30 tablet 0   albuterol (VENTOLIN HFA) 108 (90 Base) MCG/ACT inhaler Inhale 2 puffs into the lungs every 6 (six) hours as needed for wheezing or shortness of breath.  amLODipine (NORVASC) 10 MG tablet Take 10 mg by mouth daily.     aspirin EC 81 MG tablet Take 81 mg by mouth daily.     atorvastatin (LIPITOR) 80 MG tablet Take 80 mg by mouth at bedtime.     budesonide-formoterol (SYMBICORT) 80-4.5 MCG/ACT inhaler Inhale  2 puffs into the lungs 2 (two) times daily.     cholecalciferol (VITAMIN D) 1000 UNITS tablet Take 2,000 Units by mouth daily.     clotrimazole (LOTRIMIN) 1 % cream Apply 1 Application topically 2 (two) times daily.     cyanocobalamin (VITAMIN B12) 1000 MCG tablet Take 2 tablets (2,000 mcg total) by mouth daily. 60 tablet 0   [START ON 08/26/2022] dexamethasone (DECADRON) 4 MG tablet Take 10 tablets (40 mg total) by mouth once a week. 40 tablet 0   ezetimibe (ZETIA) 10 MG tablet Take 10 mg by mouth daily.     metoprolol tartrate (LOPRESSOR) 25 MG tablet Take 25 mg by mouth 2 (two) times daily.     ticagrelor (BRILINTA) 90 MG TABS tablet Take 90 mg by mouth 2 (two) times daily.     No current facility-administered medications for this visit.   Facility-Administered Medications Ordered in Other Visits  Medication Dose Route Frequency Provider Last Rate Last Admin   bortezomib SQ (VELCADE) chemo injection (2.5mg /mL concentration) 2.5 mg  1.3 mg/m2 (Treatment Plan Recorded) Subcutaneous Once Artis Delay, MD        SUMMARY OF ONCOLOGIC HISTORY: Oncology History  Multiple myeloma without remission (HCC)  08/18/2022 Initial Diagnosis   Multiple myeloma without remission (HCC)   08/19/2022 -  Chemotherapy   Patient is on Treatment Plan : MYELOMA NEWLY DIAGNOSED Cyclophosphamide IV + Bortezomib SQ + Dexamethasone (CyBorD) q21d x 8 cycles     08/22/2022 Cancer Staging   Staging form: Plasma Cell Myeloma and Plasma Cell Disorders, AJCC 8th Edition - Clinical stage from 08/22/2022: Beta-2-microglobulin (mg/L): 2.7, Albumin (g/dL): 2.8, ISS: Stage II, High-risk cytogenetics: Unknown, LDH: Normal - Signed by Artis Delay, MD on 08/22/2022 Stage prefix: Initial diagnosis Beta 2 microglobulin range (mg/L): Less than 3.5 Albumin range (g/dL): Less than 3.5 Cytogenetics: Unknown Serum calcium level: Elevated Serum creatinine level: Elevated Bone disease on imaging: Present     PHYSICAL EXAMINATION: ECOG  PERFORMANCE STATUS: 1 - Symptomatic but completely ambulatory GENERAL:alert, no distress and comfortable NEURO: alert   LABORATORY DATA:  I have reviewed the data as listed    Component Value Date/Time   NA 140 08/21/2022 0644   K 4.1 08/21/2022 0644   CL 106 08/21/2022 0644   CO2 26 08/21/2022 0644   GLUCOSE 141 (H) 08/21/2022 0644   BUN 32 (H) 08/21/2022 0644   CREATININE 1.49 (H) 08/21/2022 0644   CALCIUM 9.4 08/21/2022 0644   PROT 6.2 (L) 08/21/2022 0644   ALBUMIN 3.2 (L) 08/21/2022 0644   AST 15 08/21/2022 0644   ALT 18 08/21/2022 0644   ALKPHOS 49 08/21/2022 0644   BILITOT 0.4 08/21/2022 0644   GFRNONAA 40 (L) 08/21/2022 0644   GFRAA >90 02/19/2011 0234    No results found for: "SPEP", "UPEP"  Lab Results  Component Value Date   WBC 11.1 (H) 08/21/2022   NEUTROABS 8.4 (H) 08/21/2022   HGB 8.8 (L) 08/21/2022   HCT 27.5 (L) 08/21/2022   MCV 88.1 08/21/2022   PLT 324 08/21/2022      Chemistry      Component Value Date/Time   NA 140 08/21/2022 0644   K 4.1  08/21/2022 0644   CL 106 08/21/2022 0644   CO2 26 08/21/2022 0644   BUN 32 (H) 08/21/2022 0644   CREATININE 1.49 (H) 08/21/2022 0644      Component Value Date/Time   CALCIUM 9.4 08/21/2022 0644   ALKPHOS 49 08/21/2022 0644   AST 15 08/21/2022 0644   ALT 18 08/21/2022 0644   BILITOT 0.4 08/21/2022 0644       RADIOGRAPHIC STUDIES: I have personally reviewed the radiological images as listed and agreed with the findings in the report. DG Bone Survey Met  Result Date: 08/15/2022 CLINICAL DATA:  Multiple myeloma. EXAM: METASTATIC BONE SURVEY COMPARISON:  CT scan of August 12, 2022. Radiographs of June 20, 2011. FINDINGS: Several small lucencies are noted in the skull. Several small lucencies are noted in the distal portion of the right radius and ulna. Multilevel degenerative changes are noted in lower cervical spine. Lucency is seen involving the medial portion of left acetabulum which corresponds to the  lesion seen on CT scan. Multiple other smaller lucencies are seen throughout the visualized pelvis. IMPRESSION: Multiple lucencies are noted throughout the visualized pelvis, with the largest noted in the medial portion of the left acetabulum as seen on prior CT scan. Also noted are several small lucencies in the distal portion of the right radius and ulna. Several small lucencies are also noted in the skull. These are consistent with history of multiple myeloma. Electronically Signed   By: Lupita Raider M.D.   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 CT-guided bone lesion biopsy of the left ilium 2. CT-guided bone marrow aspiration and core biopsy MEDICATIONS: None. ANESTHESIA/SEDATION: Moderate (conscious) sedation was employed during this procedure. A total of Versed 1 mg and Fentanyl 50 mcg was administered intravenously. Moderate Sedation Time: 19 minutes. The patient's level of consciousness and vital signs were monitored continuously by radiology nursing throughout the procedure under my direct supervision. FLUOROSCOPY TIME:  N/a COMPLICATIONS: None immediate. PROCEDURE: Informed written consent was obtained from the patient after a thorough discussion of the procedural risks, benefits and alternatives. All questions were addressed. Maximal Sterile Barrier Technique was utilized including caps, mask, sterile gowns, sterile gloves, sterile drape, hand hygiene and skin antiseptic. A timeout was performed prior to the initiation of the procedure. The patient was placed prone on the exam table. Limited CT of the pelvis was performed for planning purposes. This demonstrated lytic lesion in the posterior left ilium. Skin entry site was marked, and the overlying skin was prepped and draped in the standard sterile fashion. Local analgesia was obtained with 1% lidocaine. Using intermittent CT  fluoroscopy, a 11 gauge bone biopsy needle was advanced towards the identified lesion and biopsy performed. Specimens were submitted in formalin to pathology for further handling. Attention was then turned to the contralateral side for bone marrow aspiration and core biopsy. Limited CT of the pelvis was performed for planning purposes. Skin entry site was marked, and the overlying skin was prepped and draped in the standard sterile fashion. Local analgesia was obtained with 1% lidocaine. Using CT guidance, an 11 gauge needle was advanced just deep to the cortex of the right posterior ilium. Subsequently, bone marrow aspiration and core biopsy were performed. Specimens were submitted to lab/pathology for handling. Hemostasis was achieved with manual pressure, and a clean dressing was placed. The patient tolerated the procedure well without immediate complication. IMPRESSION: 1.  Successful CT-guided focal bone core needle biopsy of lytic lesion left posterior ilium. 2. Successful CT-guided bone marrow aspiration and core biopsy of the right posterior ilium. Electronically Signed   By: Olive Bass M.D.   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 CT-guided bone lesion biopsy of the left ilium 2. CT-guided bone marrow aspiration and core biopsy MEDICATIONS: None. ANESTHESIA/SEDATION: Moderate (conscious) sedation was employed during this procedure. A total of Versed 1 mg and Fentanyl 50 mcg was administered intravenously. Moderate Sedation Time: 19 minutes. The patient's level of consciousness and vital signs were monitored continuously by radiology nursing throughout the procedure under my direct supervision. FLUOROSCOPY TIME:  N/a COMPLICATIONS: None immediate. PROCEDURE: Informed written consent was obtained from the patient after a thorough discussion of the procedural risks,  benefits and alternatives. All questions were addressed. Maximal Sterile Barrier Technique was utilized including caps, mask, sterile gowns, sterile gloves, sterile drape, hand hygiene and skin antiseptic. A timeout was performed prior to the initiation of the procedure. The patient was placed prone on the exam table. Limited CT of the pelvis was performed for planning purposes. This demonstrated lytic lesion in the posterior left ilium. Skin entry site was marked, and the overlying skin was prepped and draped in the standard sterile fashion. Local analgesia was obtained with 1% lidocaine. Using intermittent CT fluoroscopy, a 11 gauge bone biopsy needle was advanced towards the identified lesion and biopsy performed. Specimens were submitted in formalin to pathology for further handling. Attention was then turned to the contralateral side for bone marrow aspiration and core biopsy. Limited CT of the pelvis was performed for planning purposes. Skin entry site was marked, and the overlying skin was prepped and draped in the standard sterile fashion. Local analgesia was obtained with 1% lidocaine. Using CT guidance, an 11 gauge needle was advanced just deep to the cortex of the right posterior ilium. Subsequently, bone marrow aspiration and core biopsy were performed. Specimens were submitted to lab/pathology for handling. Hemostasis was achieved with manual pressure, and a clean dressing was placed. The patient tolerated the procedure well without immediate complication. IMPRESSION: 1. Successful CT-guided focal bone core needle biopsy of lytic lesion left posterior ilium. 2. Successful CT-guided bone marrow aspiration and core biopsy of the right posterior ilium. Electronically Signed   By: Olive Bass M.D.   On: 08/14/2022 13:31   DG Abd Portable 1V  Result Date: 08/14/2022 CLINICAL DATA:  Constipation EXAM: PORTABLE ABDOMEN - 1 VIEW COMPARISON:  None Available. FINDINGS: Scattered stool throughout colon,  grossly normal stool burden. Small amount gas and stool in rectum. No bowel dilatation or bowel wall thickening. Bones demineralized. IMPRESSION: Nonobstructive bowel gas pattern. Electronically Signed   By: Ulyses Southward M.D.   On: 08/14/2022 12:27   CT Head Wo Contrast  Result Date: 08/12/2022 CLINICAL DATA:  Initial evaluation for mental status change. EXAM: CT HEAD WITHOUT CONTRAST TECHNIQUE: Contiguous axial images were obtained from the base of the skull through the vertex without intravenous contrast. RADIATION DOSE REDUCTION: This exam was performed according to the departmental dose-optimization program which includes automated exposure control, adjustment of the mA and/or kV according to patient size and/or use of iterative reconstruction technique. COMPARISON:  None Available. FINDINGS: Brain: Large area of encephalomalacia involving the left cerebral hemisphere, consistent with a chronic left MCA distribution infarct. Probable superimposed areas of scattered laminar necrosis  within this area. Additional small remote right cerebellar infarct. No acute intracranial hemorrhage. No acute large vessel territory infarct. No mass lesion or midline shift. No hydrocephalus or extra-axial fluid collection. Vascular: No abnormal hyperdense vessel. Scattered calcified atherosclerosis present at the skull base. Skull: Scalp soft tissues and calvarium demonstrate no acute finding. Sinuses/Orbits: Globes and orbital soft tissues within normal limits. 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 Additional chronic mucoperiosteal thickening present about the sphenoethmoidal and maxillary sinuses. No mastoid effusion. Other: None. IMPRESSION: 1. No acute intracranial abnormality. 2. Large chronic left MCA distribution infarct, with additional small remote right cerebellar infarct. 3. 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  Electronically Signed   By: Rise Mu M.D.   On: 08/12/2022 23:10   CT ABDOMEN PELVIS WO CONTRAST  Result Date: 08/12/2022 CLINICAL DATA:  Abdominal pain, acute, nonlocalized EXAM: CT ABDOMEN AND PELVIS WITHOUT CONTRAST TECHNIQUE: Multidetector CT imaging of the abdomen and pelvis was performed following the standard protocol without IV contrast. RADIATION DOSE REDUCTION: This exam was performed according to the departmental dose-optimization program which includes automated exposure control, adjustment of the mA and/or kV according to patient size and/or use of iterative reconstruction technique. COMPARISON:  None Available. FINDINGS: Lower chest: Moderate coronary artery calcification. Right coronary artery stenting has been performed. Cardiac size within normal limits. No pericardial effusion. Visualized lung bases are clear. Hepatobiliary: No focal liver abnormality is seen. No gallstones, gallbladder wall thickening, or biliary dilatation. Pancreas: Unremarkable Spleen: Unremarkable Adrenals/Urinary Tract: The adrenal glands are unremarkable. The kidneys are normal in position. Moderate asymmetric right renal cortical atrophy. Multiple nonobstructing calculi are seen within the kidneys bilaterally measuring up to 5 mm within the lower pole the left kidney. No hydronephrosis. No ureteral calculi. Multiple simple cortical cysts are seen within the kidneys bilaterally for which no follow-up imaging is recommended. The bladder is unremarkable. Stomach/Bowel: Stomach is within normal limits. Appendix appears normal. No evidence of bowel wall thickening, distention, or inflammatory changes. Vascular/Lymphatic: Aortic atherosclerosis. No enlarged abdominal or pelvic lymph nodes. Reproductive: Dermoid cyst within the right ovary identified demonstrating macroscopic fat measuring 7.2 x 9.4 x 7.7 cm. Uterus and left adnexa are unremarkable. Other: No abdominal wall hernia. Musculoskeletal: Degenerative  changes are seen within the lumbar spine. There are superimposed scattered lytic lesions noted throughout the lumbar spine and pelvis, the largest of which are is seen within the left iliac spine and axiall image # 56/3 measuring 15 mm, suspicious for osseous metastatic disease or myeloma. Pathologic fracture noted involving the lytic lesion bobbing the inferior right pubic ramus. Age indeterminate superior endplate fracture T9 with no significant loss of height. IMPRESSION: 1. No acute intra-abdominal pathology identified. 2. Moderate coronary artery calcification. 3. Moderate asymmetric right renal cortical atrophy. 4. Mild bilateral nonobstructing nephrolithiasis. No urolithiasis. No hydronephrosis. 5. 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 6. Scattered lytic lesions throughout the lumbar spine and pelvis suspicious for osseous metastatic disease or myeloma. Minimally displaced pathologic fracture of the inferior right pubic ramus. 7. Age indeterminate superior endplate fracture of T9 with no significant loss of height. Aortic Atherosclerosis (ICD10-I70.0). Electronically Signed   By: Helyn Numbers M.D.   On: 08/12/2022 23:06

## 2022-08-22 NOTE — Assessment & Plan Note (Signed)
I recommend vitamin B12 injection once a week for 1 month and then monthly

## 2022-08-22 NOTE — Assessment & Plan Note (Addendum)
She tolerated treatment very well without major side effects Serum calcium and creatinine improving Her anemia is stable Per previous discussion, we will continue on single agent Velcade only for cycle 1 of therapy I reminded the patient and family to take weekly dexamethasone We discussed importance of prophylactic antimicrobial with acyclovir She is reminded to drink lots of fluids to avoid worsening renal failure She will also continue high-dose vitamin D supplement Discussed the need for dental clearance before IV bisphosphonates I recommend the patient and her son to consider adding Cytoxan or daratumumab I will review the recommendation again next week

## 2022-08-22 NOTE — Assessment & Plan Note (Signed)
She had received rasburicase in the hospital She has no signs of tumor lysis syndrome recently

## 2022-08-25 ENCOUNTER — Inpatient Hospital Stay: Payer: Medicaid Other

## 2022-08-25 ENCOUNTER — Other Ambulatory Visit: Payer: Self-pay

## 2022-08-25 ENCOUNTER — Encounter: Payer: Self-pay | Admitting: Hematology and Oncology

## 2022-08-25 ENCOUNTER — Encounter (HOSPITAL_COMMUNITY): Payer: Self-pay

## 2022-08-25 VITALS — BP 138/57 | HR 80 | Temp 98.7°F | Resp 18

## 2022-08-25 DIAGNOSIS — E559 Vitamin D deficiency, unspecified: Secondary | ICD-10-CM

## 2022-08-25 DIAGNOSIS — N179 Acute kidney failure, unspecified: Secondary | ICD-10-CM

## 2022-08-25 DIAGNOSIS — E538 Deficiency of other specified B group vitamins: Secondary | ICD-10-CM

## 2022-08-25 DIAGNOSIS — E790 Hyperuricemia without signs of inflammatory arthritis and tophaceous disease: Secondary | ICD-10-CM

## 2022-08-25 DIAGNOSIS — C9002 Multiple myeloma in relapse: Secondary | ICD-10-CM | POA: Diagnosis not present

## 2022-08-25 DIAGNOSIS — C9000 Multiple myeloma not having achieved remission: Secondary | ICD-10-CM

## 2022-08-25 LAB — CMP (CANCER CENTER ONLY)
ALT: 36 U/L (ref 0–44)
AST: 16 U/L (ref 15–41)
Albumin: 3.7 g/dL (ref 3.5–5.0)
Alkaline Phosphatase: 87 U/L (ref 38–126)
Anion gap: 6 (ref 5–15)
BUN: 23 mg/dL — ABNORMAL HIGH (ref 6–20)
CO2: 26 mmol/L (ref 22–32)
Calcium: 8.9 mg/dL (ref 8.9–10.3)
Chloride: 111 mmol/L (ref 98–111)
Creatinine: 1.35 mg/dL — ABNORMAL HIGH (ref 0.44–1.00)
GFR, Estimated: 46 mL/min — ABNORMAL LOW (ref >60)
Glucose, Bld: 128 mg/dL — ABNORMAL HIGH (ref 70–99)
Potassium: 4.6 mmol/L (ref 3.5–5.1)
Sodium: 143 mmol/L (ref 135–145)
Total Bilirubin: 0.2 mg/dL — ABNORMAL LOW (ref 0.3–1.2)
Total Protein: 6.5 g/dL (ref 6.5–8.1)

## 2022-08-25 LAB — CBC WITH DIFFERENTIAL (CANCER CENTER ONLY)
Abs Immature Granulocytes: 0.04 10*3/uL (ref 0.00–0.07)
Basophils Absolute: 0 10*3/uL (ref 0.0–0.1)
Basophils Relative: 0 %
Eosinophils Absolute: 0.4 10*3/uL (ref 0.0–0.5)
Eosinophils Relative: 5 %
HCT: 25.4 % — ABNORMAL LOW (ref 36.0–46.0)
Hemoglobin: 8.3 g/dL — ABNORMAL LOW (ref 12.0–15.0)
Immature Granulocytes: 0 %
Lymphocytes Relative: 13 %
Lymphs Abs: 1.2 10*3/uL (ref 0.7–4.0)
MCH: 29.1 pg (ref 26.0–34.0)
MCHC: 32.7 g/dL (ref 30.0–36.0)
MCV: 89.1 fL (ref 80.0–100.0)
Monocytes Absolute: 0.4 10*3/uL (ref 0.1–1.0)
Monocytes Relative: 4 %
Neutro Abs: 7.2 10*3/uL (ref 1.7–7.7)
Neutrophils Relative %: 78 %
Platelet Count: 283 10*3/uL (ref 150–400)
RBC: 2.85 MIL/uL — ABNORMAL LOW (ref 3.87–5.11)
RDW: 14.7 % (ref 11.5–15.5)
WBC Count: 9.2 10*3/uL (ref 4.0–10.5)
nRBC: 0 % (ref 0.0–0.2)

## 2022-08-25 LAB — TYPE AND SCREEN
ABO/RH(D): B POS
Antibody Screen: NEGATIVE

## 2022-08-25 LAB — LACTATE DEHYDROGENASE: LDH: 152 U/L (ref 98–192)

## 2022-08-25 LAB — URIC ACID: Uric Acid, Serum: 6.2 mg/dL (ref 2.5–7.1)

## 2022-08-25 LAB — PRETREATMENT RBC PHENOTYPE

## 2022-08-25 MED ORDER — CYANOCOBALAMIN 1000 MCG/ML IJ SOLN
1000.0000 ug | Freq: Once | INTRAMUSCULAR | Status: AC
  Administered 2022-08-25: 1000 ug via INTRAMUSCULAR
  Filled 2022-08-25: qty 1

## 2022-08-25 MED ORDER — BORTEZOMIB CHEMO SQ INJECTION 3.5 MG (2.5MG/ML)
1.3000 mg/m2 | Freq: Once | INTRAMUSCULAR | Status: AC
  Administered 2022-08-25: 2.5 mg via SUBCUTANEOUS
  Filled 2022-08-25: qty 1

## 2022-08-25 NOTE — Patient Instructions (Signed)
[facility]  Discharge Instructions: Thank you for choosing [facility] to provide your oncology and hematology care.   If you have a lab appointment with the Cancer Center, please go directly to the Cancer Center and check in at the registration area.   Wear comfortable clothing and clothing appropriate for easy access to any Portacath or PICC line.   We strive to give you quality time with your provider. You may need to reschedule your appointment if you arrive late (15 or more minutes).  Arriving late affects you and other patients whose appointments are after yours.  Also, if you miss three or more appointments without notifying the office, you may be dismissed from the clinic at the provider's discretion.      For prescription refill requests, have your pharmacy contact our office and allow 72 hours for refills to be completed.    Today you received the following chemotherapy and/or immunotherapy agents: Velcade     To help prevent nausea and vomiting after your treatment, we encourage you to take your nausea medication as directed.  BELOW ARE SYMPTOMS THAT SHOULD BE REPORTED IMMEDIATELY: *FEVER GREATER THAN 100.4 F (38 C) OR HIGHER *CHILLS OR SWEATING *NAUSEA AND VOMITING THAT IS NOT CONTROLLED WITH YOUR NAUSEA MEDICATION *UNUSUAL SHORTNESS OF BREATH *UNUSUAL BRUISING OR BLEEDING *URINARY PROBLEMS (pain or burning when urinating, or frequent urination) *BOWEL PROBLEMS (unusual diarrhea, constipation, pain near the anus) TENDERNESS IN MOUTH AND THROAT WITH OR WITHOUT PRESENCE OF ULCERS (sore throat, sores in mouth, or a toothache) UNUSUAL RASH, SWELLING OR PAIN  UNUSUAL VAGINAL DISCHARGE OR ITCHING   Items with * indicate a potential emergency and should be followed up as soon as possible or go to the Emergency Department if any problems should occur.  Please show the CHEMOTHERAPY ALERT CARD or IMMUNOTHERAPY ALERT CARD at check-in  to the Emergency Department and triage nurse.  Should you have questions after your visit or need to cancel or reschedule your appointment, please contact [facility]  Dept: [phone]  and follow the prompts.  Office hours are 8:00 a.m. to 4:30 p.m. Monday - Friday. Please note that voicemails left after 4:00 p.m. may not be returned until the following business day.  We are closed weekends and major holidays. You have access to a nurse at all times for urgent questions. Please call the main number to the clinic Dept: [phone] and follow the prompts.   For any non-urgent questions, you may also contact your provider using MyChart. We now offer e-Visits for anyone 18 and older to request care online for non-urgent symptoms. For details visit [email or web address].   Also download the MyChart app! Go to the app store, search "MyChart", open the app, select [facility], and log in with your MyChart username and password.   

## 2022-08-25 NOTE — Progress Notes (Signed)
Spoke w/ pt's son Mr. Garnet Sierras introducing myself as her Dance movement psychotherapist and to discuss the [employer].  Pt would like to apply so she will bring proof of income on 08/28/22.  If approved I will give her an expense sheet and my card for any questions or concerns she may have in the future.

## 2022-08-25 NOTE — Progress Notes (Signed)
Type and screen ordered per lab request.

## 2022-08-28 ENCOUNTER — Inpatient Hospital Stay: Payer: Medicaid Other

## 2022-08-28 ENCOUNTER — Inpatient Hospital Stay (HOSPITAL_BASED_OUTPATIENT_CLINIC_OR_DEPARTMENT_OTHER): Payer: Medicaid Other | Admitting: Hematology and Oncology

## 2022-08-28 ENCOUNTER — Other Ambulatory Visit: Payer: Self-pay

## 2022-08-28 ENCOUNTER — Encounter: Payer: Self-pay | Admitting: Hematology and Oncology

## 2022-08-28 VITALS — BP 127/60 | HR 66 | Temp 99.1°F | Resp 20 | Wt 193.4 lb

## 2022-08-28 DIAGNOSIS — D539 Nutritional anemia, unspecified: Secondary | ICD-10-CM

## 2022-08-28 DIAGNOSIS — I63512 Cerebral infarction due to unspecified occlusion or stenosis of left middle cerebral artery: Secondary | ICD-10-CM | POA: Diagnosis not present

## 2022-08-28 DIAGNOSIS — C9000 Multiple myeloma not having achieved remission: Secondary | ICD-10-CM

## 2022-08-28 DIAGNOSIS — E538 Deficiency of other specified B group vitamins: Secondary | ICD-10-CM | POA: Diagnosis not present

## 2022-08-28 DIAGNOSIS — C9002 Multiple myeloma in relapse: Secondary | ICD-10-CM | POA: Diagnosis not present

## 2022-08-28 MED ORDER — PROCHLORPERAZINE MALEATE 10 MG PO TABS
10.0000 mg | ORAL_TABLET | Freq: Once | ORAL | Status: AC
  Administered 2022-08-28: 10 mg via ORAL
  Filled 2022-08-28: qty 1

## 2022-08-28 MED ORDER — BORTEZOMIB CHEMO SQ INJECTION 3.5 MG (2.5MG/ML)
1.3000 mg/m2 | Freq: Once | INTRAMUSCULAR | Status: AC
  Administered 2022-08-28: 2.5 mg via SUBCUTANEOUS
  Filled 2022-08-28: qty 1

## 2022-08-28 NOTE — Assessment & Plan Note (Signed)
Her blood count is stable Monitor closely 

## 2022-08-28 NOTE — Progress Notes (Signed)
DISCONTINUE ON PATHWAY REGIMEN - Multiple Myeloma and Other Plasma Cell Dyscrasias     A cycle is every 21 days:     Dexamethasone      Bortezomib      Cyclophosphamide   **Always confirm dose/schedule in your pharmacy ordering system**  REASON: Other Reason PRIOR TREATMENT: [id number]: CyBord (Cyclophosphamide 500 mg/m2 IV D1, 8 + Bortezomib 1.3 mg/m2 SUBQ D1, 4, 8, 11 + Dexamethasone 40 mg PO D1, 8, 15) q21 Days x 8 Cycles TREATMENT RESPONSE: Partial Response (PR)  START OFF PATHWAY REGIMEN - Multiple Myeloma and Other Plasma Cell Dyscrasias   [id number]:DaraVRd (Daratumumab SUBQ + Bortezomib SUBQ D1,4,8,11 + Lenalidomide PO D1-14 + Dexamethasone 20 mg IV/PO D1,2,8,9,15,16) q21 Days (Consolidation Schema):   A cycle is every 21 days:     Lenalidomide      Dexamethasone      Bortezomib      Daratumumab and hyaluronidase-fihj   **Always confirm dose/schedule in your pharmacy ordering system**  Patient Characteristics: Multiple Myeloma, Newly Diagnosed, Transplant Ineligible or Refused, Unknown or Awaiting Test Results Disease Classification: Multiple Myeloma Therapeutic Status: Newly Diagnosed R2-ISS Staging: III Is Patient Eligible for Transplant<= Transplant Ineligible or Refused Risk Status: Awaiting Test Results Intent of Therapy: Non-Curative / Palliative Intent, Discussed with Patient

## 2022-08-28 NOTE — Assessment & Plan Note (Signed)
She tolerated treatment very well without major side effects Serum calcium and creatinine improving Her anemia is stable Per previous discussion, we will continue on single agent Velcade only for cycle 1 of therapy I reminded the patient and family to take weekly dexamethasone We discussed importance of prophylactic antimicrobial with acyclovir She is reminded to drink lots of fluids to avoid worsening renal failure She will also continue high-dose vitamin D supplement Discussed the need for dental clearance before IV bisphosphonates We have had a long discussion about the risk, benefits, side effects of adding Cytoxan versus daratumumab Ultimately, she is in agreement to proceed with addition of daratumumab She will start new protocol on May 21 I plan to repeat myeloma panel once a month starting with May 21

## 2022-08-28 NOTE — Progress Notes (Signed)
DISCONTINUE OFF PATHWAY REGIMEN - Multiple Myeloma and Other Plasma Cell Dyscrasias   [id number]:DaraVRd (Daratumumab SUBQ + Bortezomib SUBQ D1,4,8,11 + Lenalidomide PO D1-14 + Dexamethasone 20 mg IV/PO D1,2,8,9,15,16) q21 Days (Consolidation Schema):   A cycle is every 21 days:     Lenalidomide      Dexamethasone      Bortezomib      Daratumumab and hyaluronidase-fihj   **Always confirm dose/schedule in your pharmacy ordering system**  REASON: Other Reason PRIOR TREATMENT: Off Pathway: DaraVRd (Daratumumab SUBQ + Bortezomib SUBQ D1,4,8,11 + Lenalidomide PO D1-14 + Dexamethasone 20 mg IV/PO D1,2,8,9,15,16) q21 Days (Consolidation Schema) TREATMENT RESPONSE: Unable to Evaluate  START OFF PATHWAY REGIMEN - Multiple Myeloma and Other Plasma Cell Dyscrasias   [id number]:DaraVd (Daratumumab SUBQ + Bortezomib SUBQ + Dexamethasone PO/IV) q21/28 Days:   Cycles 1 through 3: A cycle is every 21 days:     Dexamethasone      Bortezomib      Daratumumab and hyaluronidase-fihj    Cycles 4 through 8: A cycle is every 21 days:     Dexamethasone      Bortezomib      Daratumumab and hyaluronidase-fihj    Cycles 9 and beyond: A cycle is every 28 days:     Daratumumab and hyaluronidase-fihj   **Always confirm dose/schedule in your pharmacy ordering system**  Patient Characteristics: Multiple Myeloma, Newly Diagnosed, Transplant Ineligible or Refused, Unknown or Awaiting Test Results Disease Classification: Multiple Myeloma Therapeutic Status: Newly Diagnosed R2-ISS Staging: III Is Patient Eligible for Transplant<= Transplant Ineligible or Refused Risk Status: Awaiting Test Results Intent of Therapy: Non-Curative / Palliative Intent, Discussed with Patient

## 2022-08-28 NOTE — Patient Instructions (Signed)
[facility]  Discharge Instructions: Thank you for choosing [facility] to provide your oncology and hematology care.   If you have a lab appointment with the Cancer Center, please go directly to the Cancer Center and check in at the registration area.   Wear comfortable clothing and clothing appropriate for easy access to any Portacath or PICC line.   We strive to give you quality time with your provider. You may need to reschedule your appointment if you arrive late (15 or more minutes).  Arriving late affects you and other patients whose appointments are after yours.  Also, if you miss three or more appointments without notifying the office, you may be dismissed from the clinic at the provider's discretion.      For prescription refill requests, have your pharmacy contact our office and allow 72 hours for refills to be completed.    Today you received the following chemotherapy and/or immunotherapy agents: Velcade     To help prevent nausea and vomiting after your treatment, we encourage you to take your nausea medication as directed.  BELOW ARE SYMPTOMS THAT SHOULD BE REPORTED IMMEDIATELY: *FEVER GREATER THAN 100.4 F (38 C) OR HIGHER *CHILLS OR SWEATING *NAUSEA AND VOMITING THAT IS NOT CONTROLLED WITH YOUR NAUSEA MEDICATION *UNUSUAL SHORTNESS OF BREATH *UNUSUAL BRUISING OR BLEEDING *URINARY PROBLEMS (pain or burning when urinating, or frequent urination) *BOWEL PROBLEMS (unusual diarrhea, constipation, pain near the anus) TENDERNESS IN MOUTH AND THROAT WITH OR WITHOUT PRESENCE OF ULCERS (sore throat, sores in mouth, or a toothache) UNUSUAL RASH, SWELLING OR PAIN  UNUSUAL VAGINAL DISCHARGE OR ITCHING   Items with * indicate a potential emergency and should be followed up as soon as possible or go to the Emergency Department if any problems should occur.  Please show the CHEMOTHERAPY ALERT CARD or IMMUNOTHERAPY ALERT CARD at check-in  to the Emergency Department and triage nurse.  Should you have questions after your visit or need to cancel or reschedule your appointment, please contact [facility]  Dept: [phone]  and follow the prompts.  Office hours are 8:00 a.m. to 4:30 p.m. Monday - Friday. Please note that voicemails left after 4:00 p.m. may not be returned until the following business day.  We are closed weekends and major holidays. You have access to a nurse at all times for urgent questions. Please call the main number to the clinic Dept: [phone] and follow the prompts.   For any non-urgent questions, you may also contact your provider using MyChart. We now offer e-Visits for anyone 18 and older to request care online for non-urgent symptoms. For details visit [email or web address].   Also download the MyChart app! Go to the app store, search "MyChart", open the app, select [facility], and log in with your MyChart username and password.   

## 2022-08-28 NOTE — Assessment & Plan Note (Signed)
Due to baseline stroke, I plan to reduce Velcade dose a little bit

## 2022-08-28 NOTE — Progress Notes (Signed)
[facility] OFFICE PROGRESS NOTE  Patient Care Team: [name], MD as PCP - General (Internal Medicine)  ASSESSMENT & PLAN:  Multiple myeloma without remission (HCC) She tolerated treatment very well without major side effects Serum calcium and creatinine improving Her anemia is stable Per previous discussion, we will continue on single agent Velcade only for cycle 1 of therapy I reminded the patient and family to take weekly dexamethasone We discussed importance of prophylactic antimicrobial with acyclovir She is reminded to drink lots of fluids to avoid worsening renal failure She will also continue high-dose vitamin D supplement Discussed the need for dental clearance before IV bisphosphonates We have had a long discussion about the risk, benefits, side effects of adding Cytoxan versus daratumumab Ultimately, she is in agreement to proceed with addition of daratumumab She will start new protocol on May 21 I plan to repeat myeloma panel once a month starting with May 21  Vitamin B12 deficiency I recommend vitamin B12 injection once a week for 1 month and then monthly  Deficiency anemia Her blood count is stable Monitor closely  Cerebrovascular accident (CVA) due to stenosis of left middle cerebral artery (HCC) Due to baseline stroke, I plan to reduce Velcade dose a little bit  Orders Placed This Encounter  Procedures   CBC with Differential (Cancer Center Only)    Standing Status:   Future    Standing Expiration Date:   09/09/2023   CMP (Cancer Center only)    Standing Status:   Future    Standing Expiration Date:   09/09/2023   CBC with Differential (Cancer Center Only)    Standing Status:   Future    Standing Expiration Date:   09/16/2023   CMP (Cancer Center only)    Standing Status:   Future    Standing Expiration Date:   09/16/2023   CBC with Differential (Cancer Center Only)    Standing Status:   Future    Standing Expiration Date:   09/23/2023    CBC with Differential (Cancer Center Only)    Standing Status:   Future    Standing Expiration Date:   09/30/2023   CMP (Cancer Center only)    Standing Status:   Future    Standing Expiration Date:   09/30/2023   CBC with Differential (Cancer Center Only)    Standing Status:   Future    Standing Expiration Date:   10/07/2023   CMP (Cancer Center only)    Standing Status:   Future    Standing Expiration Date:   10/07/2023   CBC with Differential (Cancer Center Only)    Standing Status:   Future    Standing Expiration Date:   10/14/2023   CBC with Differential (Cancer Center Only)    Standing Status:   Future    Standing Expiration Date:   10/21/2023   CMP (Cancer Center only)    Standing Status:   Future    Standing Expiration Date:   10/21/2023   CBC with Differential (Cancer Center Only)    Standing Status:   Future    Standing Expiration Date:   10/28/2023   CMP (Cancer Center only)    Standing Status:   Future    Standing Expiration Date:   10/28/2023   CBC with Differential (Cancer Center Only)    Standing Status:   Future    Standing Expiration Date:   11/04/2023   CBC with Differential (Cancer Center Only)    Standing Status:  Future    Standing Expiration Date:   11/11/2023   CMP (Cancer Center only)    Standing Status:   Future    Standing Expiration Date:   11/11/2023   CBC with Differential (Cancer Center Only)    Standing Status:   Future    Standing Expiration Date:   11/18/2023   CMP (Cancer Center only)    Standing Status:   Future    Standing Expiration Date:   11/18/2023   CBC with Differential (Cancer Center Only)    Standing Status:   Future    Standing Expiration Date:   12/02/2023   CMP (Cancer Center only)    Standing Status:   Future    Standing Expiration Date:   12/02/2023   CBC with Differential (Cancer Center Only)    Standing Status:   Future    Standing Expiration Date:   12/09/2023   CMP (Cancer Center only)    Standing Status:   Future    Standing  Expiration Date:   12/09/2023   CBC with Differential (Cancer Center Only)    Standing Status:   Future    Standing Expiration Date:   12/23/2023   CMP (Cancer Center only)    Standing Status:   Future    Standing Expiration Date:   12/23/2023   CBC with Differential (Cancer Center Only)    Standing Status:   Future    Standing Expiration Date:   12/30/2023   CMP (Cancer Center only)    Standing Status:   Future    Standing Expiration Date:   12/30/2023   CBC with Differential (Cancer Center Only)    Standing Status:   Future    Standing Expiration Date:   01/13/2024   CMP (Cancer Center only)    Standing Status:   Future    Standing Expiration Date:   01/13/2024   CBC with Differential (Cancer Center Only)    Standing Status:   Future    Standing Expiration Date:   01/20/2024   CMP (Cancer Center only)    Standing Status:   Future    Standing Expiration Date:   01/20/2024   CBC with Differential (Cancer Center Only)    Standing Status:   Future    Standing Expiration Date:   02/03/2024   CMP (Cancer Center only)    Standing Status:   Future    Standing Expiration Date:   02/03/2024   CBC with Differential (Cancer Center Only)    Standing Status:   Future    Standing Expiration Date:   02/10/2024   CMP (Cancer Center only)    Standing Status:   Future    Standing Expiration Date:   02/10/2024   CBC with Differential (Cancer Center Only)    Standing Status:   Future    Standing Expiration Date:   02/24/2024   CBC with Differential (Cancer Center Only)    Standing Status:   Future    Standing Expiration Date:   03/23/2024   CBC with Differential (Cancer Center Only)    Standing Status:   Future    Standing Expiration Date:   04/20/2024   CBC with Differential (Cancer Center Only)    Standing Status:   Future    Standing Expiration Date:   05/18/2024   Kappa/lambda light chains    Standing Status:   Standing    Number of Occurrences:   22    Standing Expiration Date:   08/28/2023  Multiple Myeloma Panel (SPEP&IFE w/QIG)    Standing Status:   Standing    Number of Occurrences:   22    Standing Expiration Date:   08/28/2023    All questions were answered. The patient knows to call the clinic with any problems, questions or concerns. The total time spent in the appointment was 55 minutes encounter with patients including review of chart and various tests results, discussions about plan of care and coordination of care plan   Artis Delay, MD 08/28/2022 3:20 PM  INTERVAL HISTORY: Please see below for problem oriented charting. she returns for treatment follow-up with her son She tolerated recent treatment well We spent a lot of time reviewing blood test results and discussed treatment options  REVIEW OF SYSTEMS:   Constitutional: Denies fevers, chills or abnormal weight loss Eyes: Denies blurriness of vision Ears, nose, mouth, throat, and face: Denies mucositis or sore throat Respiratory: Denies cough, dyspnea or wheezes Cardiovascular: Denies palpitation, chest discomfort or lower extremity swelling Gastrointestinal:  Denies nausea, heartburn or change in bowel habits Skin: Denies abnormal skin rashes Lymphatics: Denies new lymphadenopathy or easy bruising Neurological:Denies numbness, tingling or new weaknesses Behavioral/Psych: Mood is stable, no new changes  All other systems were reviewed with the patient and are negative.  I have reviewed the past medical history, past surgical history, social history and family history with the patient and they are unchanged from previous note.  ALLERGIES:  has No Known Allergies.  MEDICATIONS:  Current Outpatient Medications  Medication Sig Dispense Refill   acyclovir (ZOVIRAX) 400 MG tablet Take 1 tablet (400 mg total) by mouth daily. 30 tablet 0   albuterol (VENTOLIN HFA) 108 (90 Base) MCG/ACT inhaler Inhale 2 puffs into the lungs every 6 (six) hours as needed for wheezing or shortness of breath.     amLODipine  (NORVASC) 10 MG tablet Take 10 mg by mouth daily.     aspirin EC 81 MG tablet Take 81 mg by mouth daily.     atorvastatin (LIPITOR) 80 MG tablet Take 80 mg by mouth at bedtime.     budesonide-formoterol (SYMBICORT) 80-4.5 MCG/ACT inhaler Inhale 2 puffs into the lungs 2 (two) times daily.     cholecalciferol (VITAMIN D) 1000 UNITS tablet Take 2,000 Units by mouth daily.     clotrimazole (LOTRIMIN) 1 % cream Apply 1 Application topically 2 (two) times daily.     cyanocobalamin (VITAMIN B12) 1000 MCG tablet Take 2 tablets (2,000 mcg total) by mouth daily. 60 tablet 0   dexamethasone (DECADRON) 4 MG tablet Take 10 tablets (40 mg total) by mouth once a week. 40 tablet 0   ezetimibe (ZETIA) 10 MG tablet Take 10 mg by mouth daily.     metoprolol tartrate (LOPRESSOR) 25 MG tablet Take 25 mg by mouth 2 (two) times daily.     ticagrelor (BRILINTA) 90 MG TABS tablet Take 90 mg by mouth 2 (two) times daily.     No current facility-administered medications for this visit.    SUMMARY OF ONCOLOGIC HISTORY: Oncology History  Multiple myeloma without remission (HCC)  08/18/2022 Initial Diagnosis   Multiple myeloma without remission (HCC)   08/19/2022 - 08/28/2022 Chemotherapy   Patient is on Treatment Plan : MYELOMA NEWLY DIAGNOSED Cyclophosphamide IV + Bortezomib SQ + Dexamethasone (CyBorD) q21d x 8 cycles     08/22/2022 Cancer Staging   Staging form: Plasma Cell Myeloma and Plasma Cell Disorders, AJCC 8th Edition - Clinical stage from 08/22/2022: Beta-2-microglobulin (mg/L): 2.7, Albumin (g/dL):  2.8, ISS: Stage II, High-risk cytogenetics: Unknown, LDH: Normal - Signed by Artis Delay, MD on 08/22/2022 Stage prefix: Initial diagnosis Beta 2 microglobulin range (mg/L): Less than 3.5 Albumin range (g/dL): Less than 3.5 Cytogenetics: Unknown Serum calcium level: Elevated Serum creatinine level: Elevated Bone disease on imaging: Present   09/09/2022 - 09/09/2022 Chemotherapy   Patient is on Treatment Plan :  MYELOMA NEWLY DIAGNOSED TRANSPLANT CANDIDATE DaraVRd (Daratumumab SQ) q21d x 6 Cycles (Induction/Consolidation)     09/09/2022 -  Chemotherapy   Patient is on Treatment Plan : MYELOMA RELAPSED / REFRACTORY Daratumumab SQ + Bortezomib + Dexamethasone (DaraVd) q21d / Daratumumab SQ q28d        PHYSICAL EXAMINATION: ECOG PERFORMANCE STATUS: 1 - Symptomatic but completely ambulatory  Vitals:   08/28/22 1311  BP: 127/60  Pulse: 66  Resp: 20  Temp: 99.1 F (37.3 C)  SpO2: 100%   Filed Weights   08/28/22 1311  Weight: 193 lb 6.4 oz (87.7 kg)    GENERAL:alert, no distress and comfortable NEURO: alert   LABORATORY DATA:  I have reviewed the data as listed    Component Value Date/Time   NA 143 08/25/2022 1313   K 4.6 08/25/2022 1313   CL 111 08/25/2022 1313   CO2 26 08/25/2022 1313   GLUCOSE 128 (H) 08/25/2022 1313   BUN 23 (H) 08/25/2022 1313   CREATININE 1.35 (H) 08/25/2022 1313   CALCIUM 8.9 08/25/2022 1313   PROT 6.5 08/25/2022 1313   ALBUMIN 3.7 08/25/2022 1313   AST 16 08/25/2022 1313   ALT 36 08/25/2022 1313   ALKPHOS 87 08/25/2022 1313   BILITOT 0.2 (L) 08/25/2022 1313   GFRNONAA 46 (L) 08/25/2022 1313   GFRAA >90 02/19/2011 0234    No results found for: "SPEP", "UPEP"  Lab Results  Component Value Date   WBC 9.2 08/25/2022   NEUTROABS 7.2 08/25/2022   HGB 8.3 (L) 08/25/2022   HCT 25.4 (L) 08/25/2022   MCV 89.1 08/25/2022   PLT 283 08/25/2022      Chemistry      Component Value Date/Time   NA 143 08/25/2022 1313   K 4.6 08/25/2022 1313   CL 111 08/25/2022 1313   CO2 26 08/25/2022 1313   BUN 23 (H) 08/25/2022 1313   CREATININE 1.35 (H) 08/25/2022 1313      Component Value Date/Time   CALCIUM 8.9 08/25/2022 1313   ALKPHOS 87 08/25/2022 1313   AST 16 08/25/2022 1313   ALT 36 08/25/2022 1313   BILITOT 0.2 (L) 08/25/2022 1313       RADIOGRAPHIC STUDIES: I have personally reviewed the radiological images as listed and agreed with the findings  in the report. DG Bone Survey Met  Result Date: 08/15/2022 CLINICAL DATA:  Multiple myeloma. EXAM: METASTATIC BONE SURVEY COMPARISON:  CT scan of August 12, 2022. Radiographs of June 20, 2011. FINDINGS: Several small lucencies are noted in the skull. Several small lucencies are noted in the distal portion of the right radius and ulna. Multilevel degenerative changes are noted in lower cervical spine. Lucency is seen involving the medial portion of left acetabulum which corresponds to the lesion seen on CT scan. Multiple other smaller lucencies are seen throughout the visualized pelvis. IMPRESSION: Multiple lucencies are noted throughout the visualized pelvis, with the largest noted in the medial portion of the left acetabulum as seen on prior CT scan. Also noted are several small lucencies in the distal portion of the right radius and ulna. Several small lucencies  are also noted in the skull. These are consistent with history of multiple myeloma. Electronically Signed   By: Lupita Raider M.D.   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 CT-guided bone lesion biopsy of the left ilium 2. CT-guided bone marrow aspiration and core biopsy MEDICATIONS: None. ANESTHESIA/SEDATION: Moderate (conscious) sedation was employed during this procedure. A total of Versed 1 mg and Fentanyl 50 mcg was administered intravenously. Moderate Sedation Time: 19 minutes. The patient's level of consciousness and vital signs were monitored continuously by radiology nursing throughout the procedure under my direct supervision. FLUOROSCOPY TIME:  N/a COMPLICATIONS: None immediate. PROCEDURE: Informed written consent was obtained from the patient after a thorough discussion of the procedural risks, benefits and alternatives. All questions were addressed. Maximal Sterile Barrier Technique was utilized including caps, mask,  sterile gowns, sterile gloves, sterile drape, hand hygiene and skin antiseptic. A timeout was performed prior to the initiation of the procedure. The patient was placed prone on the exam table. Limited CT of the pelvis was performed for planning purposes. This demonstrated lytic lesion in the posterior left ilium. Skin entry site was marked, and the overlying skin was prepped and draped in the standard sterile fashion. Local analgesia was obtained with 1% lidocaine. Using intermittent CT fluoroscopy, a 11 gauge bone biopsy needle was advanced towards the identified lesion and biopsy performed. Specimens were submitted in formalin to pathology for further handling. Attention was then turned to the contralateral side for bone marrow aspiration and core biopsy. Limited CT of the pelvis was performed for planning purposes. Skin entry site was marked, and the overlying skin was prepped and draped in the standard sterile fashion. Local analgesia was obtained with 1% lidocaine. Using CT guidance, an 11 gauge needle was advanced just deep to the cortex of the right posterior ilium. Subsequently, bone marrow aspiration and core biopsy were performed. Specimens were submitted to lab/pathology for handling. Hemostasis was achieved with manual pressure, and a clean dressing was placed. The patient tolerated the procedure well without immediate complication. IMPRESSION: 1. Successful CT-guided focal bone core needle biopsy of lytic lesion left posterior ilium. 2. Successful CT-guided bone marrow aspiration and core biopsy of the right posterior ilium. Electronically Signed   By: Olive Bass M.D.   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 CT-guided bone lesion biopsy of the left ilium 2. CT-guided bone marrow aspiration and core biopsy MEDICATIONS: None. ANESTHESIA/SEDATION: Moderate  (conscious) sedation was employed during this procedure. A total of Versed 1 mg and Fentanyl 50 mcg was administered intravenously. Moderate Sedation Time: 19 minutes. The patient's level of consciousness and vital signs were monitored continuously by radiology nursing throughout the procedure under my direct supervision. FLUOROSCOPY TIME:  N/a COMPLICATIONS: None immediate. PROCEDURE: Informed written consent was obtained from the patient after a thorough discussion of the procedural risks, benefits and alternatives. All questions were addressed. Maximal Sterile Barrier Technique was utilized including caps, mask, sterile gowns, sterile gloves, sterile drape, hand hygiene and skin antiseptic. A timeout was performed prior to the initiation of the procedure. The patient was placed prone on the exam table. Limited CT of the pelvis was performed for planning purposes. This demonstrated lytic lesion in the posterior left ilium. Skin entry site was marked, and the overlying  skin was prepped and draped in the standard sterile fashion. Local analgesia was obtained with 1% lidocaine. Using intermittent CT fluoroscopy, a 11 gauge bone biopsy needle was advanced towards the identified lesion and biopsy performed. Specimens were submitted in formalin to pathology for further handling. Attention was then turned to the contralateral side for bone marrow aspiration and core biopsy. Limited CT of the pelvis was performed for planning purposes. Skin entry site was marked, and the overlying skin was prepped and draped in the standard sterile fashion. Local analgesia was obtained with 1% lidocaine. Using CT guidance, an 11 gauge needle was advanced just deep to the cortex of the right posterior ilium. Subsequently, bone marrow aspiration and core biopsy were performed. Specimens were submitted to lab/pathology for handling. Hemostasis was achieved with manual pressure, and a clean dressing was placed. The patient tolerated the  procedure well without immediate complication. IMPRESSION: 1. Successful CT-guided focal bone core needle biopsy of lytic lesion left posterior ilium. 2. Successful CT-guided bone marrow aspiration and core biopsy of the right posterior ilium. Electronically Signed   By: Olive Bass M.D.   On: 08/14/2022 13:31   DG Abd Portable 1V  Result Date: 08/14/2022 CLINICAL DATA:  Constipation EXAM: PORTABLE ABDOMEN - 1 VIEW COMPARISON:  None Available. FINDINGS: Scattered stool throughout colon, grossly normal stool burden. Small amount gas and stool in rectum. No bowel dilatation or bowel wall thickening. Bones demineralized. IMPRESSION: Nonobstructive bowel gas pattern. Electronically Signed   By: Ulyses Southward M.D.   On: 08/14/2022 12:27   CT Head Wo Contrast  Result Date: 08/12/2022 CLINICAL DATA:  Initial evaluation for mental status change. EXAM: CT HEAD WITHOUT CONTRAST TECHNIQUE: Contiguous axial images were obtained from the base of the skull through the vertex without intravenous contrast. RADIATION DOSE REDUCTION: This exam was performed according to the departmental dose-optimization program which includes automated exposure control, adjustment of the mA and/or kV according to patient size and/or use of iterative reconstruction technique. COMPARISON:  None Available. FINDINGS: Brain: Large area of encephalomalacia involving the left cerebral hemisphere, consistent with a chronic left MCA distribution infarct. Probable superimposed areas of scattered laminar necrosis within this area. Additional small remote right cerebellar infarct. No acute intracranial hemorrhage. No acute large vessel territory infarct. No mass lesion or midline shift. No hydrocephalus or extra-axial fluid collection. Vascular: No abnormal hyperdense vessel. Scattered calcified atherosclerosis present at the skull base. Skull: Scalp soft tissues and calvarium demonstrate no acute finding. Sinuses/Orbits: Globes and orbital soft  tissues within normal limits. 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 Additional chronic mucoperiosteal thickening present about the sphenoethmoidal and maxillary sinuses. No mastoid effusion. Other: None. IMPRESSION: 1. No acute intracranial abnormality. 2. Large chronic left MCA distribution infarct, with additional small remote right cerebellar infarct. 3. 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 Electronically Signed   By: Rise Mu M.D.   On: 08/12/2022 23:10   CT ABDOMEN PELVIS WO CONTRAST  Result Date: 08/12/2022 CLINICAL DATA:  Abdominal pain, acute, nonlocalized EXAM: CT ABDOMEN AND PELVIS WITHOUT CONTRAST TECHNIQUE: Multidetector CT imaging of the abdomen and pelvis was performed following the standard protocol without IV contrast. RADIATION DOSE REDUCTION: This exam was performed according to the departmental dose-optimization program which includes automated exposure control, adjustment of the mA and/or kV according to patient size and/or use of iterative reconstruction technique. COMPARISON:  None Available. FINDINGS: Lower chest: Moderate coronary artery calcification. Right  coronary artery stenting has been performed. Cardiac size within normal limits. No pericardial effusion. Visualized lung bases are clear. Hepatobiliary: No focal liver abnormality is seen. No gallstones, gallbladder wall thickening, or biliary dilatation. Pancreas: Unremarkable Spleen: Unremarkable Adrenals/Urinary Tract: The adrenal glands are unremarkable. The kidneys are normal in position. Moderate asymmetric right renal cortical atrophy. Multiple nonobstructing calculi are seen within the kidneys bilaterally measuring up to 5 mm within the lower pole the left kidney. No hydronephrosis. No ureteral calculi. Multiple simple cortical cysts are seen within the kidneys bilaterally for which no follow-up imaging is  recommended. The bladder is unremarkable. Stomach/Bowel: Stomach is within normal limits. Appendix appears normal. No evidence of bowel wall thickening, distention, or inflammatory changes. Vascular/Lymphatic: Aortic atherosclerosis. No enlarged abdominal or pelvic lymph nodes. Reproductive: Dermoid cyst within the right ovary identified demonstrating macroscopic fat measuring 7.2 x 9.4 x 7.7 cm. Uterus and left adnexa are unremarkable. Other: No abdominal wall hernia. Musculoskeletal: Degenerative changes are seen within the lumbar spine. There are superimposed scattered lytic lesions noted throughout the lumbar spine and pelvis, the largest of which are is seen within the left iliac spine and axiall image # 56/3 measuring 15 mm, suspicious for osseous metastatic disease or myeloma. Pathologic fracture noted involving the lytic lesion bobbing the inferior right pubic ramus. Age indeterminate superior endplate fracture T9 with no significant loss of height. IMPRESSION: 1. No acute intra-abdominal pathology identified. 2. Moderate coronary artery calcification. 3. Moderate asymmetric right renal cortical atrophy. 4. Mild bilateral nonobstructing nephrolithiasis. No urolithiasis. No hydronephrosis. 5. 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 6. Scattered lytic lesions throughout the lumbar spine and pelvis suspicious for osseous metastatic disease or myeloma. Minimally displaced pathologic fracture of the inferior right pubic ramus. 7. Age indeterminate superior endplate fracture of T9 with no significant loss of height. Aortic Atherosclerosis (ICD10-I70.0). Electronically Signed   By: Helyn Numbers M.D.   On: 08/12/2022 23:06

## 2022-08-28 NOTE — Assessment & Plan Note (Signed)
I recommend vitamin B12 injection once a week for 1 month and then monthly 

## 2022-08-29 ENCOUNTER — Other Ambulatory Visit: Payer: Self-pay

## 2022-08-31 ENCOUNTER — Other Ambulatory Visit: Payer: Self-pay

## 2022-09-04 ENCOUNTER — Encounter (HOSPITAL_COMMUNITY): Payer: Self-pay

## 2022-09-08 ENCOUNTER — Telehealth: Payer: Self-pay | Admitting: Hematology and Oncology

## 2022-09-08 NOTE — Telephone Encounter (Signed)
Spoke with patient confirming upcoming appointment change  

## 2022-09-09 ENCOUNTER — Inpatient Hospital Stay: Payer: Medicaid Other | Admitting: Hematology and Oncology

## 2022-09-09 ENCOUNTER — Inpatient Hospital Stay: Payer: Medicaid Other

## 2022-09-09 ENCOUNTER — Telehealth: Payer: Self-pay

## 2022-09-09 ENCOUNTER — Other Ambulatory Visit: Payer: Self-pay | Admitting: Hematology and Oncology

## 2022-09-09 DIAGNOSIS — C9000 Multiple myeloma not having achieved remission: Secondary | ICD-10-CM

## 2022-09-09 NOTE — Telephone Encounter (Signed)
Called son and told unable to move appts to later today due to being a first time dose. Due to the timing of treatment unable to move appts. Due to holiday we are closed on Monday 5/27. Reminded of appt date time on 5/28. Sons verbalized understanding.

## 2022-09-09 NOTE — Telephone Encounter (Signed)
Called son regarding missed appt today. They had appt time mixed up and thought that the appt was at 2 pm. Appts canceled for today. Forwarded a message to charge nurse and scheduler to see if appt can be rescheduled to another day this week.

## 2022-09-16 ENCOUNTER — Inpatient Hospital Stay: Payer: Medicaid Other

## 2022-09-16 ENCOUNTER — Other Ambulatory Visit: Payer: Self-pay | Admitting: Hematology and Oncology

## 2022-09-16 ENCOUNTER — Inpatient Hospital Stay: Payer: Medicaid Other | Admitting: Hematology and Oncology

## 2022-09-16 ENCOUNTER — Telehealth: Payer: Self-pay

## 2022-09-16 NOTE — Telephone Encounter (Signed)
Called son and told appt for today canceled due to being late and appt for 5/31 canceled due to not getting treatment today. Adora and family left before I could speak with them in the lobby. Reminded son of appt on 6/4 at 1145 am, reminded to arrive early. He verbalized understanding and appreciated the call.

## 2022-09-19 ENCOUNTER — Ambulatory Visit: Payer: PRIVATE HEALTH INSURANCE

## 2022-09-22 ENCOUNTER — Telehealth: Payer: Self-pay

## 2022-09-22 ENCOUNTER — Other Ambulatory Visit: Payer: Self-pay | Admitting: Hematology and Oncology

## 2022-09-22 NOTE — Telephone Encounter (Signed)
Returned call to son. They are back in [place] and need to cancel appts. She would like to get treatment closer to her home. They are having housing issues in [place]. Son would like to cancel all appts. Told him a Dr. Jinny Blossom, his Moms PCP in [place] called to request call and records to assist with referral in [place]. Dr. Bertis Ruddy will call PCP back at [phone].

## 2022-09-23 ENCOUNTER — Inpatient Hospital Stay: Payer: PRIVATE HEALTH INSURANCE

## 2022-09-23 ENCOUNTER — Inpatient Hospital Stay: Payer: PRIVATE HEALTH INSURANCE | Admitting: Hematology and Oncology

## 2022-09-23 ENCOUNTER — Telehealth: Payer: Self-pay

## 2022-09-23 NOTE — Telephone Encounter (Signed)
Faxed referral info to PCP in [place], [place] per her request. Lawrence is back in [place], PCP with assist with referral closer to her home. Faxed referral info to [phone], received fax confirmation.

## 2022-09-30 ENCOUNTER — Ambulatory Visit: Payer: PRIVATE HEALTH INSURANCE | Admitting: Hematology and Oncology

## 2022-09-30 ENCOUNTER — Ambulatory Visit: Payer: PRIVATE HEALTH INSURANCE

## 2022-09-30 ENCOUNTER — Other Ambulatory Visit: Payer: PRIVATE HEALTH INSURANCE

## 2022-10-03 ENCOUNTER — Ambulatory Visit: Payer: PRIVATE HEALTH INSURANCE

## 2022-10-07 ENCOUNTER — Ambulatory Visit: Payer: PRIVATE HEALTH INSURANCE

## 2022-10-07 ENCOUNTER — Ambulatory Visit: Payer: PRIVATE HEALTH INSURANCE | Admitting: Hematology and Oncology

## 2022-10-07 ENCOUNTER — Other Ambulatory Visit: Payer: PRIVATE HEALTH INSURANCE

## 2022-10-10 ENCOUNTER — Ambulatory Visit: Payer: PRIVATE HEALTH INSURANCE

## 2022-10-14 ENCOUNTER — Ambulatory Visit: Payer: PRIVATE HEALTH INSURANCE | Admitting: Hematology and Oncology

## 2022-10-14 ENCOUNTER — Ambulatory Visit: Payer: PRIVATE HEALTH INSURANCE

## 2022-10-14 ENCOUNTER — Other Ambulatory Visit: Payer: PRIVATE HEALTH INSURANCE
